# Patient Record
Sex: Female | Born: 1970 | Race: Asian | Hispanic: No | Marital: Married | Smoking: Never smoker
Health system: Southern US, Community
[De-identification: ages and names within clinical notes are randomized; demographics above are authoritative.]

## PROBLEM LIST (undated history)

## (undated) DIAGNOSIS — T7840XA Allergy, unspecified, initial encounter: Secondary | ICD-10-CM

## (undated) DIAGNOSIS — B001 Herpesviral vesicular dermatitis: Secondary | ICD-10-CM

## (undated) HISTORY — DX: Herpesviral vesicular dermatitis: B00.1

## (undated) HISTORY — DX: Allergy, unspecified, initial encounter: T78.40XA

---

## 2009-05-15 ENCOUNTER — Ambulatory Visit (HOSPITAL_COMMUNITY): Admission: RE | Admit: 2009-05-15 | Discharge: 2009-05-15 | Payer: Self-pay | Admitting: Obstetrics and Gynecology

## 2009-06-26 ENCOUNTER — Ambulatory Visit: Payer: Self-pay | Admitting: Oncology

## 2009-07-02 ENCOUNTER — Inpatient Hospital Stay (HOSPITAL_COMMUNITY): Admission: AD | Admit: 2009-07-02 | Discharge: 2009-07-02 | Payer: Self-pay | Admitting: Obstetrics

## 2009-07-05 LAB — PROTIME-INR

## 2009-07-05 LAB — CBC WITH DIFFERENTIAL/PLATELET
BASO%: 0.2 % (ref 0.0–2.0)
EOS%: 0.5 % (ref 0.0–7.0)
HCT: 34.8 % (ref 34.8–46.6)
HGB: 12.1 g/dL (ref 11.6–15.9)
MCH: 32.2 pg (ref 25.1–34.0)
MCHC: 34.8 g/dL (ref 31.5–36.0)
MCV: 92.5 fL (ref 79.5–101.0)
MONO#: 0.3 10*3/uL (ref 0.1–0.9)
NEUT%: 74 % (ref 38.4–76.8)
RBC: 3.77 10*6/uL (ref 3.70–5.45)
lymph#: 0.7 10*3/uL — ABNORMAL LOW (ref 0.9–3.3)

## 2009-07-05 LAB — MORPHOLOGY

## 2009-07-08 LAB — COMPREHENSIVE METABOLIC PANEL
ALT: 12 U/L (ref 0–35)
AST: 19 U/L (ref 0–37)
Albumin: 3.5 g/dL (ref 3.5–5.2)
Alkaline Phosphatase: 63 U/L (ref 39–117)
Potassium: 3.4 mEq/L — ABNORMAL LOW (ref 3.5–5.3)
Sodium: 137 mEq/L (ref 135–145)
Total Protein: 6.2 g/dL (ref 6.0–8.3)

## 2009-07-08 LAB — APTT: aPTT: 27 seconds (ref 24–37)

## 2009-09-06 ENCOUNTER — Inpatient Hospital Stay (HOSPITAL_COMMUNITY): Admission: AD | Admit: 2009-09-06 | Discharge: 2009-09-09 | Payer: Self-pay | Admitting: Obstetrics and Gynecology

## 2009-10-03 ENCOUNTER — Ambulatory Visit: Payer: Self-pay | Admitting: Oncology

## 2009-10-21 LAB — MORPHOLOGY: PLT EST: DECREASED

## 2009-10-21 LAB — CBC WITH DIFFERENTIAL/PLATELET
Basophils Absolute: 0 10*3/uL (ref 0.0–0.1)
Eosinophils Absolute: 0.1 10*3/uL (ref 0.0–0.5)
HCT: 38.6 % (ref 34.8–46.6)
HGB: 13.3 g/dL (ref 11.6–15.9)
LYMPH%: 29.2 % (ref 14.0–49.7)
MCHC: 34.4 g/dL (ref 31.5–36.0)
MONO#: 0.4 10*3/uL (ref 0.1–0.9)
NEUT#: 2.3 10*3/uL (ref 1.5–6.5)
NEUT%: 59.2 % (ref 38.4–76.8)
lymph#: 1.2 10*3/uL (ref 0.9–3.3)

## 2009-10-21 LAB — CHCC SMEAR

## 2009-12-18 ENCOUNTER — Ambulatory Visit: Payer: Self-pay | Admitting: Oncology

## 2010-02-18 ENCOUNTER — Ambulatory Visit: Payer: Self-pay | Admitting: Oncology

## 2010-07-22 LAB — CBC
HCT: 29.6 % — ABNORMAL LOW (ref 36.0–46.0)
HCT: 38.8 % (ref 36.0–46.0)
Hemoglobin: 10.6 g/dL — ABNORMAL LOW (ref 12.0–15.0)
Hemoglobin: 13.6 g/dL (ref 12.0–15.0)
MCV: 93 fL (ref 78.0–100.0)
Platelets: 84 10*3/uL — ABNORMAL LOW (ref 150–400)
RBC: 4.18 MIL/uL (ref 3.87–5.11)
RDW: 14.6 % (ref 11.5–15.5)
WBC: 6.3 10*3/uL (ref 4.0–10.5)

## 2010-07-22 LAB — ABO/RH: ABO/RH(D): O POS

## 2010-07-22 LAB — TYPE AND SCREEN: ABO/RH(D): O POS

## 2010-07-22 LAB — RPR: RPR Ser Ql: NONREACTIVE

## 2010-07-28 LAB — DIFFERENTIAL
Basophils Absolute: 0 10*3/uL (ref 0.0–0.1)
Basophils Relative: 0 % (ref 0–1)
Eosinophils Relative: 0 % (ref 0–5)
Lymphocytes Relative: 6 % — ABNORMAL LOW (ref 12–46)
Monocytes Absolute: 0.3 10*3/uL (ref 0.1–1.0)
Neutro Abs: 4.6 10*3/uL (ref 1.7–7.7)

## 2010-07-28 LAB — CBC
MCV: 92.1 fL (ref 78.0–100.0)
Platelets: 95 10*3/uL — ABNORMAL LOW (ref 150–400)

## 2012-10-14 ENCOUNTER — Ambulatory Visit (INDEPENDENT_AMBULATORY_CARE_PROVIDER_SITE_OTHER)
Admission: RE | Admit: 2012-10-14 | Discharge: 2012-10-14 | Disposition: A | Discharge status: Home / Self Care | Payer: 59 | Source: Ambulatory Visit | Attending: Internal Medicine | Admitting: Internal Medicine

## 2012-10-14 ENCOUNTER — Ambulatory Visit (INDEPENDENT_AMBULATORY_CARE_PROVIDER_SITE_OTHER): Payer: 59 | Admitting: Internal Medicine

## 2012-10-14 ENCOUNTER — Encounter: Payer: Self-pay | Admitting: Internal Medicine

## 2012-10-14 VITALS — BP 106/70 | HR 72 | Temp 98.3°F | Ht 63.0 in | Wt 119.0 lb

## 2012-10-14 DIAGNOSIS — R05 Cough: Secondary | ICD-10-CM

## 2012-10-14 DIAGNOSIS — R059 Cough, unspecified: Secondary | ICD-10-CM

## 2012-10-14 DIAGNOSIS — J309 Allergic rhinitis, unspecified: Secondary | ICD-10-CM

## 2012-10-14 MED ORDER — PREDNISONE (PAK) 10 MG PO TABS: ORAL_TABLET | ORAL | Status: DC

## 2012-10-14 MED ORDER — TRAMADOL HCL 50 MG PO TABS: ORAL_TABLET | ORAL | Status: DC

## 2012-10-14 NOTE — Patient Instructions (Addendum)
The key to effective treatment for your cough is eliminating the non-stop cycle of cough you're stuck in long enough to let your airway heal completely and then see if there is anything still making you cough once you stop the cough suppression, but this should take no more than 5 days to figure out  First take delsym two tsp every 12 hours and supplement if needed with  tramadol 50 mg up to 2 every 4 hours to suppress the urge to cough at all or even clear your throat. Swallowing water or using ice chips/non mint and menthol containing candies (such as lifesavers or sugarless jolly ranchers) are also effective.  You should rest your voice and avoid activities that you know make you cough.  Once you have eliminated the cough for 3 straight days try reducing the tramadol first,  then the delsym as tolerated.    Try prilosec 20mg   Take 30-60 min before first meal of the day and Pepcid 20 mg one bedtime until cough is completely gone for at least a week without the need for cough suppression   GERD (REFLUX)  is an extremely common cause of respiratory symptoms, many times with no significant heartburn at all.    It can be treated with medication, but also with lifestyle changes including avoidance of late meals, excessive alcohol, smoking cessation, and avoid fatty foods, chocolate, peppermint, colas, red wine, and acidic juices such as orange juice.  NO MINT OR MENTHOL PRODUCTS SO NO COUGH DROPS  USE SUGARLESS CANDY INSTEAD (jolley ranchers or Stover's)  NO OIL BASED VITAMINS - use powdered substitutes.    Prednisone 10 mg take  4 each am x 2 days,   2 each am x 2 days,  1 each am x 2 days and stop    Tramadol also works for pain   Add chlortrimeton 4mg  in evening as long as having any runny nose, cough, drainage   Please remember to go to the  x-ray department downstairs for your tests - we will call you with the results when they are available.    If not better call [facility] [phone] to  schedule a sinus ct and follow up with me

## 2012-10-14 NOTE — Progress Notes (Signed)
  Subjective:    Patient ID: Morgan Conley, female    DOB: 07-Sep-1970  MRN: [id number]  HPI  42 yo [place] female never smoker never resp problems until round 2008 when arrived in [place] noted onset April yearly  classic seasonal rhinitis never had cough with it until  Onset April 2014 referred 10/14/2012 by Dr Dewaine Oats for refractory cough  10/14/2012 1st pulmonary eval cc cough worse after supper esp using when voice a lot  X 2 months,  Prod min mucus white, did get some better with antibiotics,  Coughs so hard hurts to cough R post > ant cp with neg cxr per pt around 1st week in May 2014.    No obvious daytime variabilty or assoc sob or cp or chest tightness, subjective wheeze overt sinus or hb symptoms. No unusual exp hx or h/o childhood pna/ asthma or knowledge of premature birth.   Sleeping ok without nocturnal  or early am exacerbation  of respiratory  c/o's or need for noct saba. Also denies any obvious fluctuation of symptoms with weather or environmental changes or other aggravating or alleviating factors except as outlined above   .   Review of Systems  Constitutional: Negative for fever, chills and unexpected weight change.  HENT: Negative for ear pain, nosebleeds, congestion, sore throat, rhinorrhea, sneezing, trouble swallowing, dental problem, voice change, postnasal drip and sinus pressure.   Eyes: Negative for visual disturbance.  Respiratory: Positive for cough. Negative for choking and shortness of breath.   Cardiovascular: Positive for chest pain. Negative for leg swelling.  Gastrointestinal: Negative for vomiting, abdominal pain and diarrhea.  Genitourinary: Negative for difficulty urinating.  Musculoskeletal: Negative for arthralgias.  Skin: Negative for rash.  Neurological: Negative for tremors, syncope and headaches.  Hematological: Does not bruise/bleed easily.       Objective:   Physical Exam  Pleasant amb wf nad  <  Stated age Wt Readings from Last 3 Encounters:   10/14/12 119 lb (53.978 kg)    HEENT: nl dentition, turbinates, and orophanx. Nl external ear canals without cough reflex   NECK :  without JVD/Nodes/TM/ nl carotid upstrokes bilaterally   LUNGS: no acc muscle use, clear to A and P bilaterally without cough on insp or exp maneuvers   CV:  RRR  no s3 or murmur or increase in P2, no edema   ABD:  soft and nontender with nl excursion in the supine position. No bruits or organomegaly, bowel sounds nl  MS:  warm without deformities, calf tenderness, cyanosis or clubbing  SKIN: warm and dry without lesions    NEURO:  alert, approp, no deficits    CXR  10/14/2012 :  Hyperinflated lungs without acute infiltrate      Assessment & Plan:

## 2012-10-15 DIAGNOSIS — J309 Allergic rhinitis, unspecified: Secondary | ICD-10-CM | POA: Insufficient documentation

## 2012-10-15 NOTE — Assessment & Plan Note (Signed)
The most common causes of chronic cough in immunocompetent adults include the following: upper airway cough syndrome (UACS), previously referred to as postnasal drip syndrome (PNDS), which is caused by variety of rhinosinus conditions; (2) asthma; (3) GERD; (4) chronic bronchitis from cigarette smoking or other inhaled environmental irritants; (5) nonasthmatic eosinophilic bronchitis; and (6) bronchiectasis.   These conditions, singly or in combination, have accounted for up to 94% of the causes of chronic cough in prospective studies.   Other conditions have constituted no >6% of the causes in prospective studies These have included bronchogenic carcinoma, chronic interstitial pneumonia, sarcoidosis, left ventricular failure, ACEI-induced cough, and aspiration from a condition associated with pharyngeal dysfunction.    Chronic cough is often simultaneously caused by more than one condition. A single cause has been found from 38 to 82% of the time, multiple causes from 18 to 62%. Multiply caused cough has been the result of three diseases up to 42% of the time.      Most likely this is  Classic Upper airway cough syndrome, so named because it's frequently impossible to sort out how much is  CR/sinusitis with freq throat clearing (which can be related to primary GERD)   vs  causing  secondary (" extra esophageal")  GERD from wide swings in gastric pressure that occur with throat clearing, often  promoting self use of mint and menthol lozenges that reduce the lower esophageal sphincter tone and exacerbate the problem further in a cyclical fashion.   These are the same pts (now being labeled as having "irritable larynx syndrome" by some cough centers) who not infrequently have a history of having failed to tolerate ace inhibitors,  dry powder inhalers or biphosphonates or report having atypical reflux symptoms that don't respond to standard doses of PPI , and are easily confused as having aecopd or asthma  flares by even experienced allergists/ pulmonologists.  CP likely form muscle/rib injury from coughing so needs cyclical cough protocol that uses tramadol to completely eliminate the cough plus change to 1st gen H1 to eliminate pnds.  The standardized cough guidelines published in Chest by [place] in 2006 are still the best available and consist of a multiple step process (up to 12!) , not a single office visit,  and are intended  to address this problem logically,  with an alogrithm dependent on response to empiric treatment at  each progressive step  to determine a specific diagnosis with  minimal addtional testing needed. Therefore if adherence is an issue or can't be accurately verified,  it's very unlikely the standard evaluation and treatment will be successful here.    Furthermore, response to therapy (other than acute cough suppression, which should only be used short term with avoidance of narcotic containing cough syrups if possible), can be a gradual process for which the patient may perceive immediate benefit.  Unlike going to an eye doctor where the best perscription is almost always the first one and is immediately effective, this is almost never the case in the management of chronic cough syndromes. Therefore the patient needs to commit up front to consistently adhere to recommendations  for up to 6 weeks of therapy directed at the likely underlying problem(s) before the response can be reasonably evaluated.

## 2012-10-25 ENCOUNTER — Telehealth: Payer: Self-pay | Admitting: Internal Medicine

## 2012-10-25 NOTE — Telephone Encounter (Signed)
Notes Recorded by Nyoka Cowden, MD on 10/14/2012 at 5:03 PM Call pt: Reviewed cxr and no acute change so no change in recommendations made at ov   I spoke with patient about results and she verbalized understanding.

## 2014-11-14 IMAGING — CR DG CHEST 2V
2 series · 2 of 2 positions shown · non-contrast
Comparison: 09/14/2012

CLINICAL DATA: Right upper chest pain, cough

CHEST - 2 VIEW

[view not recorded (1 of 2)]
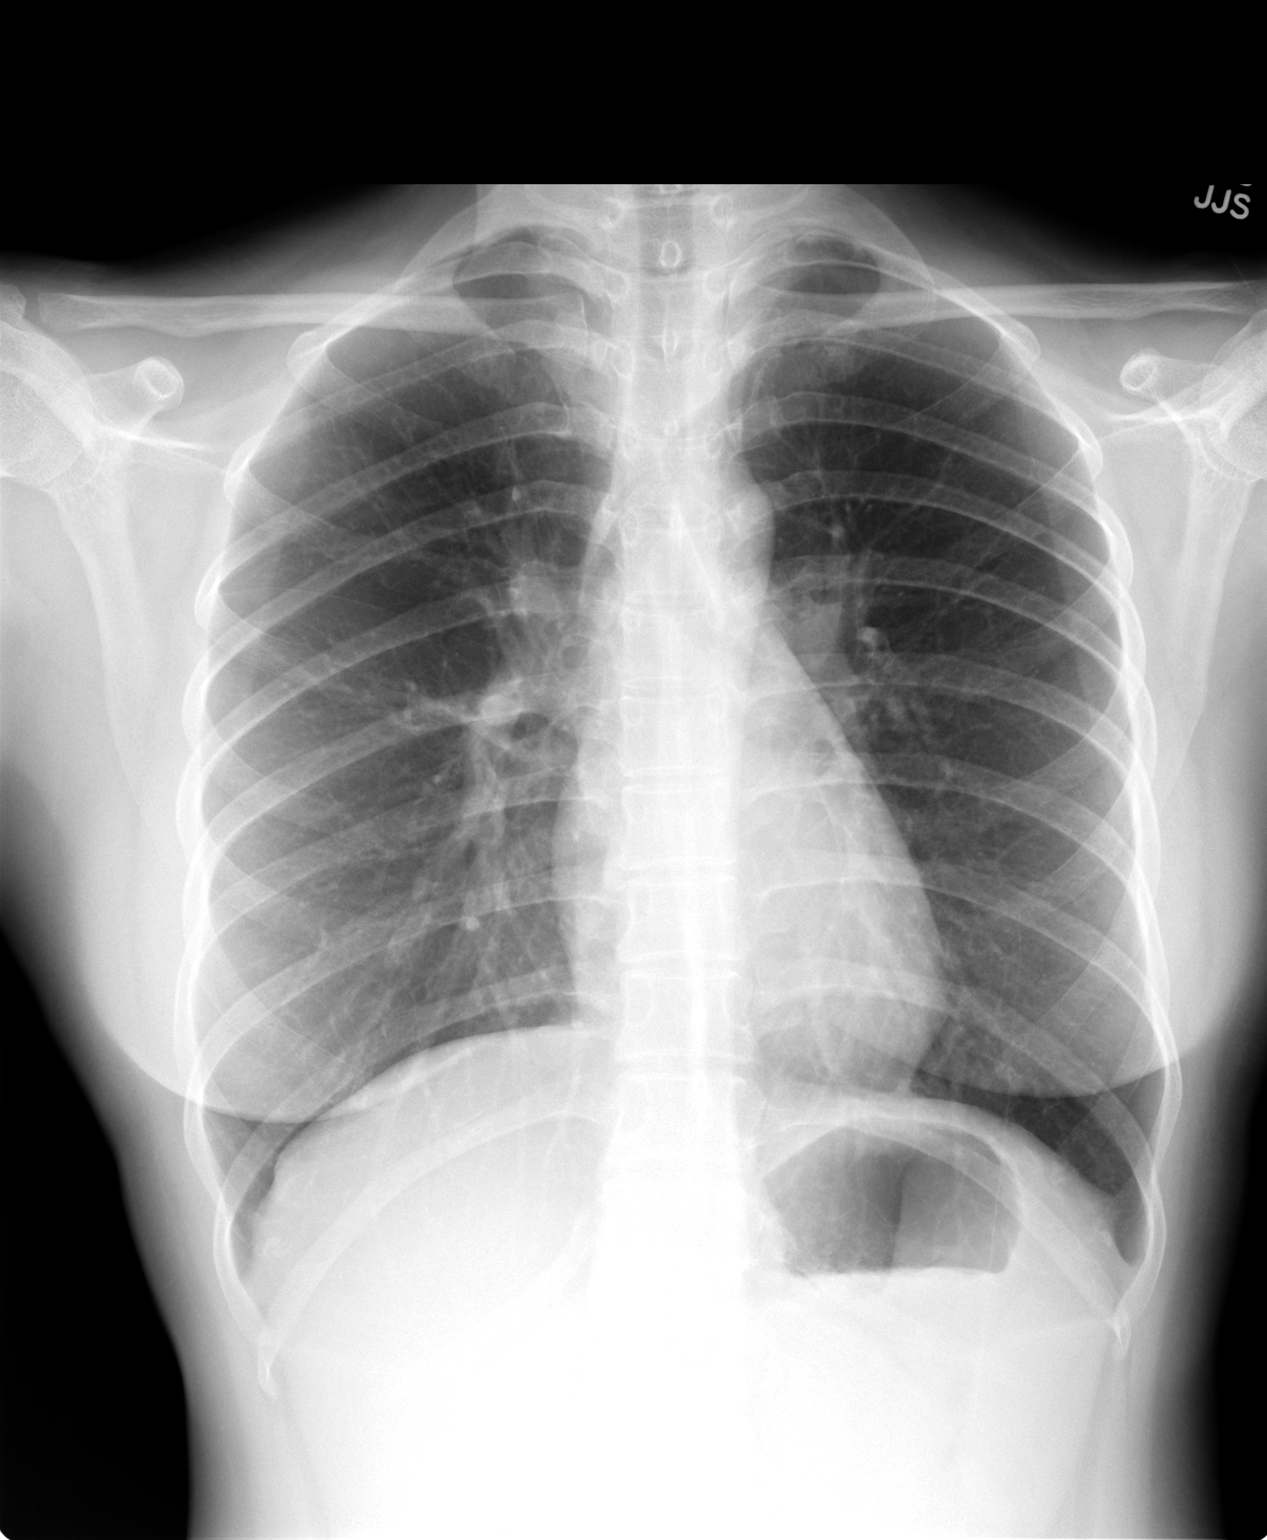

[view not recorded (2 of 2)]
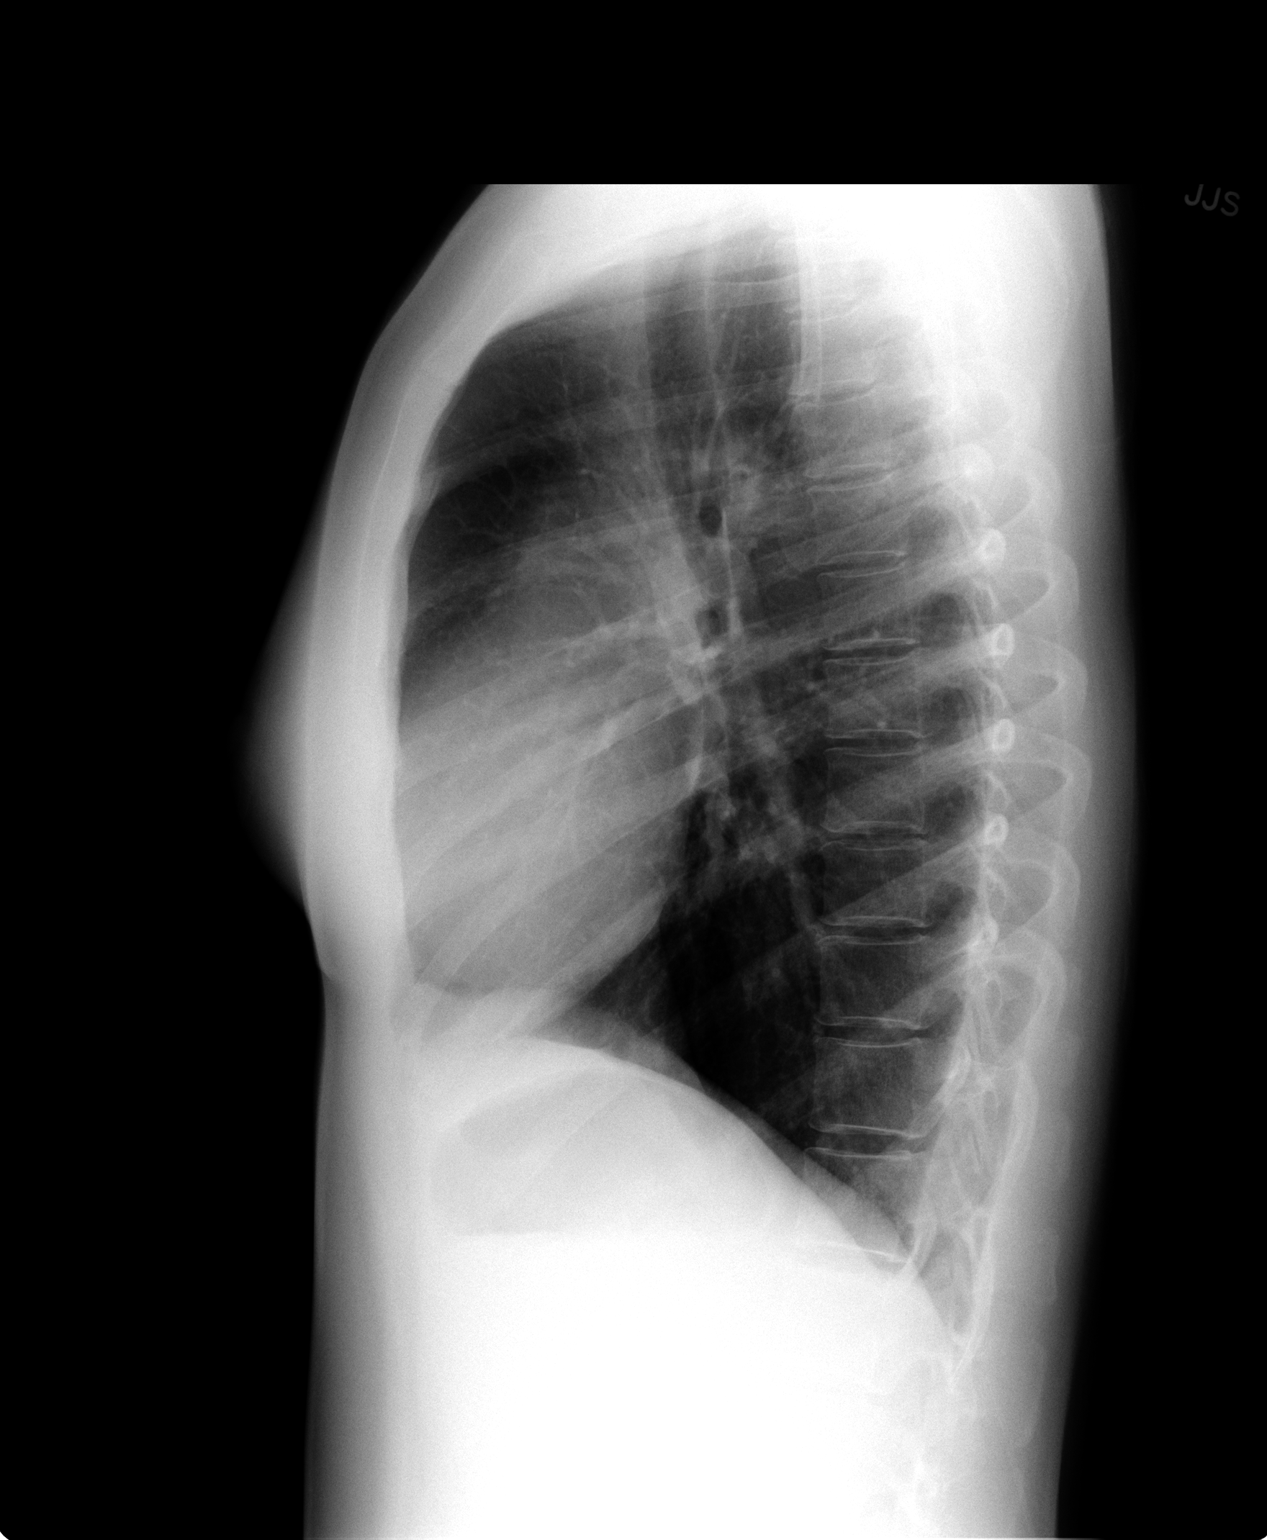

[2 of 2 positions shown; findings below may reference images not displayed]

FINDINGS: Normal heart size, mediastinal contours, and pulmonary vascularity.
Lungs hyperinflated but clear.
No pleural effusion or pneumothorax.
Bones unremarkable.
IMPRESSION: Hyperinflated lungs without acute infiltrate.

## 2017-11-01 LAB — HM MAMMOGRAPHY

## 2017-11-01 LAB — HM PAP SMEAR

## 2017-11-16 DIAGNOSIS — Z1231 Encounter for screening mammogram for malignant neoplasm of breast: Secondary | ICD-10-CM | POA: Diagnosis not present

## 2017-11-16 DIAGNOSIS — Z6822 Body mass index (BMI) 22.0-22.9, adult: Secondary | ICD-10-CM | POA: Diagnosis not present

## 2017-11-16 DIAGNOSIS — Z01419 Encounter for gynecological examination (general) (routine) without abnormal findings: Secondary | ICD-10-CM | POA: Diagnosis not present

## 2017-12-14 ENCOUNTER — Encounter: Payer: Self-pay | Admitting: Family Medicine

## 2017-12-14 ENCOUNTER — Ambulatory Visit: Payer: Commercial Managed Care - PPO | Admitting: Family Medicine

## 2017-12-14 ENCOUNTER — Other Ambulatory Visit: Payer: Self-pay

## 2017-12-14 VITALS — BP 122/82 | HR 71 | Temp 97.9°F | Ht 63.0 in | Wt 125.8 lb

## 2017-12-14 DIAGNOSIS — B001 Herpesviral vesicular dermatitis: Secondary | ICD-10-CM | POA: Diagnosis not present

## 2017-12-14 DIAGNOSIS — J301 Allergic rhinitis due to pollen: Secondary | ICD-10-CM | POA: Diagnosis not present

## 2017-12-14 HISTORY — DX: Herpesviral vesicular dermatitis: B00.1

## 2017-12-14 MED ORDER — VALACYCLOVIR HCL 1 G PO TABS: 2000.0000 mg | ORAL_TABLET | Freq: Two times a day (BID) | ORAL | 2 refills | Status: DC

## 2017-12-14 NOTE — Progress Notes (Signed)
Subjective  CC:  Chief Complaint  Patient presents with  . Establish Care    Last saw a PCP in 2014, sees OB-GYN Dr. Billy Coastaavon 11/2017  . Mouth Lesions    Had Cold sore and was on Antiviral Meds, she states she feels a needle like feeling in her lips     HPI: Morgan Conley is a 47 y.o. female who [facility] to establish care with me as a new patient.   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   Had large painful cold sore 2 weeks ago. Onset same as menstrual cycle. [facility] with acyclovir tid x 10 days. Pt reports improvement but still with occ paresthesia. As well she is concerned that it will return with her menstrual cycle. First ever cold sore. No known trigger/stressors.   AR on meds as needed.  HM: up to date; see gyn. Likely had Tdap but will get records.   Assessment  1. Seasonal allergic rhinitis due to pollen   2. Recurrent cold sores      Plan   AR  Counseling and education on cold sores, etiology, prognosis and recommended tx if recurs. Reassured, should resolve completely soon. advil as needed. Valtrex 2gbid if recurs.   F/u for cpe in jan 2020  Orders Placed This Encounter  Procedures  . HM MAMMOGRAPHY  . HM PAP SMEAR   Meds ordered this encounter  Medications  . valACYclovir (VALTREX) 1000 MG tablet    Sig: Take 2 tablets (2,000 mg total) by mouth 2 (two) times daily. Once, as needed for cold sores    Dispense:  20 tablet    Refill:  2     [facility] 12/14/2017  Decreased Interest 0  Down, Depressed, Hopeless 0  PHQ - 2 Score 0    We updated and reviewed the patient's past history in detail and it is documented below.  Patient Active Problem List   Diagnosis Date Noted  . 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 Allergic rhinitis 10/15/2012   Health Maintenance  Topic Date Due    . Janet BerlinETANUS/TDAP  04/20/1990  . INFLUENZA VACCINE  12/02/2017  . PAP SMEAR  11/01/2020  . HIV Screening  Completed   There is no immunization history for the selected administration types on file for this patient. No outpatient medications have been marked as taking for the 12/14/17 encounter (Office Visit) with [name], [name], MD.    Allergies: Patient has No Known Allergies. Past Medical History Patient  has a past medical history of Allergy and Recurrent cold sores (12/14/2017). Past Surgical History Patient  has a past surgical history that includes Cesarean section (2003, 2011). Family History: Patient family history includes Hypertension in her father. Social History:  Patient  reports that she has never smoked. She has never used smokeless tobacco. She reports that she drinks alcohol. She reports that she does not use drugs.  Review of Systems: Constitutional: negative for fever or malaise Ophthalmic: negative for photophobia, double vision or loss of vision Cardiovascular: negative for chest pain, dyspnea on exertion, or new LE swelling Respiratory: negative for SOB or persistent cough Gastrointestinal: negative for abdominal pain, change in bowel habits or melena Genitourinary: negative for dysuria or gross hematuria Musculoskeletal: negative for new gait disturbance or muscular weakness Integumentary: negative for new or persistent rashes Neurological:  negative for TIA or stroke symptoms Psychiatric: negative for SI or delusions Allergic/Immunologic: negative for hives  Patient Care Team    Relationship Specialty Notifications Start End  Marinda ElkFried, Robert, MD PCP - General Family Medicine  10/14/12     Objective  Vitals: BP 122/82   Pulse 71   Temp 97.9 F (36.6 C)   Ht 5\' 3"  (1.6 m)   Wt 125 lb 12.8 oz (57.1 kg)   SpO2 96%   BMI 22.28 kg/m  General:  Well developed, well nourished, no acute distress  Psych:  Alert and oriented,normal mood and affect HEENT:   Normocephalic, atraumatic, non-icteric sclera, PERRL, oropharynx is without mass or exudate, supple neck without adenopathy, mass or thyromegaly Cardiovascular:  RRR without gallop, rub or murmur, nondisplaced PMI Respiratory:  Good breath sounds bilaterally, CTAB with normal respiratory effort   Commons side effects, risks, benefits, and alternatives for medications and treatment plan prescribed today were discussed, and the patient expressed understanding of the given instructions. Patient is instructed to call or message via MyChart if he/she has any questions or concerns regarding our treatment plan. No barriers to understanding were identified. We discussed Red Flag symptoms and signs in detail. Patient expressed understanding regarding what to do in case of urgent or emergency type symptoms.   Medication list was reconciled, printed and provided to the patient in AVS. Patient instructions and summary information was reviewed with the patient as documented in the AVS. This note was prepared with assistance of Dragon voice recognition software. Occasional wrong-word or sound-a-like substitutions may have occurred due to the inherent limitations of voice recognition software

## 2017-12-14 NOTE — Patient Instructions (Signed)
Please return in January for your annual complete physical; please come fasting.  Please sign a release of information from Dr. Billy Coastaavon.  It was a pleasure meeting you today! Thank you for choosing us to meet your healthcare needs! I truly look forward to working with you. If you have any questions or concerns, please send me a message via Mychart or call the office at [phone].   Cold Sore A cold sore, also called a fever blister, is a skin infection that causes small, fluid-filled sores to form inside of the mouth or on the lips, gums, nose, chin, or cheeks. Cold sores can spread to other parts of the body, such as the eyes or fingers. In some people with other medical conditions, cold sores can spread to multiple other body sites, including the genitals. Cold sores can be spread or passed from person to person (contagious) until the sores crust over completely. What are the causes? Cold sores are caused by the herpes simplex virus (HSV-1). HSV-1 is closely related to the virus that causes genital herpes (HSV-2), but these viruses are not the same. Once a person is infected with HSV-1, the virus remains permanently in the body. HSV-1 is spread from person to person through close contact, such as through kissing, touching the affected area, or sharing personal items such as lip balm, razors, or eating utensils. What increases the risk? A cold sore outbreak is more likely to develop in people who:  Are tired, stressed, or sick.  Are menstruating.  Are pregnant.  Take certain medicines.  Are exposed to cold weather or too much sun.  What are the signs or symptoms? Symptoms of a cold sore outbreak often go through different stages. Here is how a cold sore develops:  Tingling, itching, or burning is felt 1-2 days before the outbreak.  Fluid-filled blisters appear on the lips, inside the mouth, on the nose, or on the cheeks.  The blisters start to ooze clear fluid.  The blisters dry  up and a yellow crust appears in its place.  The crust falls off.  Other symptoms include:  Fever.  Sore throat.  Headache.  Muscle aches.  Swollen neck glands.  You also may not have any symptoms. How is this diagnosed? This condition is often diagnosed based on your medical history and a physical exam. Your health care provider may swab your sore and then examine it in the lab. Rarely, blood tests may be done to check for HSV-1. How is this treated? There is no cure for cold sores or HSV-1. There also is no vaccine for HSV-1. Most cold sores go away on their own without treatment within two weeks. Medicines cannot make the infection go away, but medicines can:  Help relieve some of the pain associated with the sores.  Work to stop the virus from multiplying.  Shorten healing time.  Medicines may be in the form of creams, gels, pills, or a shot. Follow these instructions at home: Medicines  Take or apply over-the-counter and prescription medicines only as told by your health care provider.  Use a cotton-tip swab to apply creams or gels to your sores. Sore Care  Do not touch the sores or pick the scabs.  Wash your hands often. Do not touch your eyes without washing your hands first.  Keep the sores clean and dry.  If directed, apply ice to the sores: ? Put ice in a plastic bag. ? Place a towel between your skin and the bag. ?  Leave the ice on for 20 minutes, 2-3 times per day. Lifestyle  Do not kiss, have oral sex, or share personal items until your sores heal.  Eat a soft, bland diet. Avoid eating hot, cold, or salty foods. These can hurt your mouth.  Use a straw if it hurts to drink out of a glass.  Avoid the sun and limit your stress if these things trigger outbreaks. If sun causes cold sores, apply sunscreen on your lips before being out in the sun. Contact a health care provider if:  You have symptoms for more than two weeks.  You have pus coming from  the sores.  You have redness that is spreading.  You have pain or irritation in your eye.  You get sores on your genitals.  Your sores do not heal within two weeks.  You have frequent cold sore outbreaks. Get help right away if:  You have a fever and your symptoms suddenly get worse.  You have a headache and confusion. This information is not intended to replace advice given to you by your health care provider. Make sure you discuss any questions you have with your health care provider. Document Released: 04/17/2000 Document Revised: 12/13/2015 Document Reviewed: 02/08/2015 Elsevier Interactive Patient Education  [employer]2018 [employer]

## 2018-01-07 ENCOUNTER — Other Ambulatory Visit: Payer: Self-pay

## 2018-01-07 ENCOUNTER — Ambulatory Visit: Payer: Commercial Managed Care - PPO | Admitting: Family Medicine

## 2018-01-07 ENCOUNTER — Encounter: Payer: Self-pay | Admitting: Family Medicine

## 2018-01-07 VITALS — BP 118/80 | HR 73 | Temp 98.2°F | Ht 63.0 in | Wt 124.8 lb

## 2018-01-07 DIAGNOSIS — L710 Perioral dermatitis: Secondary | ICD-10-CM

## 2018-01-07 DIAGNOSIS — K120 Recurrent oral aphthae: Secondary | ICD-10-CM | POA: Diagnosis not present

## 2018-01-07 LAB — CBC WITH DIFFERENTIAL/PLATELET
BASOS ABS: 0 10*3/uL (ref 0.0–0.1)
Basophils Relative: 0.7 % (ref 0.0–3.0)
EOS ABS: 0.1 10*3/uL (ref 0.0–0.7)
Eosinophils Relative: 1.9 % (ref 0.0–5.0)
HCT: 38.5 % (ref 36.0–46.0)
Hemoglobin: 12.6 g/dL (ref 12.0–15.0)
LYMPHS ABS: 1.3 10*3/uL (ref 0.7–4.0)
Lymphocytes Relative: 25.6 % (ref 12.0–46.0)
MCHC: 32.8 g/dL (ref 30.0–36.0)
MCV: 83.3 fl (ref 78.0–100.0)
MONO ABS: 0.5 10*3/uL (ref 0.1–1.0)
Monocytes Relative: 9.8 % (ref 3.0–12.0)
NEUTROS PCT: 62 % (ref 43.0–77.0)
Neutro Abs: 3.2 10*3/uL (ref 1.4–7.7)
Platelets: 178 10*3/uL (ref 150.0–400.0)
RBC: 4.62 Mil/uL (ref 3.87–5.11)
RDW: 15.1 % (ref 11.5–15.5)
WBC: 5.2 10*3/uL (ref 4.0–10.5)

## 2018-01-07 LAB — B12 AND FOLATE PANEL
FOLATE: 19.8 ng/mL (ref >5.9)
VITAMIN B 12: 472 pg/mL (ref 211–911)

## 2018-01-07 LAB — TSH: TSH: 1.11 u[IU]/mL (ref 0.35–4.50)

## 2018-01-07 MED ORDER — KETOCONAZOLE 2 % EX CREA: TOPICAL_CREAM | CUTANEOUS | 0 refills | Status: DC

## 2018-01-07 NOTE — Patient Instructions (Addendum)
Please follow up if symptoms do not improve or as needed.  Start taking ibuprofen 2tablets 2-3x/day with meals to treat the pain. Use Oragel - apply to sores on inside of lip and tongue to numb the pain Start Vitamin C daily Oral Ulcers Oral ulcers are sores inside the mouth or near the mouth. They may be called canker sores or cold sores, which are two types of oral ulcers. Many oral ulcers are harmless and go away on their own. In some cases, oral ulcers may require medical care to determine the cause and proper treatment. What are the causes? Common causes of this condition include:  Viral, bacterial, or fungal infection.  Emotional stress.  Foods or chemicals that irritate the mouth.  Injury or physical irritation of the mouth.  Medicines.  Allergies.  Tobacco use.  Less common causes include:  Skin disease.  A type of herpes virus infection (herpes simplexor herpes zoster).  Oral cancer.  In some cases, the cause of this condition may not be known. What increases the risk? Oral ulcers are more likely to develop in:  People who wear dental braces, dentures, or retainers.  People who do not keep their mouth clean or brush their teeth regularly.  People who have sensitive skin.  People who have conditions that affect the entire body (systemic conditions), such as immune disorders.  What are the signs or symptoms? The main symptom of this condition is one or more oval-shaped or round ulcers that have red borders. Details about symptoms may vary depending on the cause.  Location of the ulcers. They may be inside the mouth, on the gums, or on the insides of the lips or cheeks. They may also be on the lips or on skin that is near the mouth, such as the cheeks and chin.  Pain. Ulcers can be painful and uncomfortable, or they can be painless.  Appearance of the ulcers. They may look like red blisters and be filled with fluid, or they may be white or yellow  patches.  Frequency of outbreaks. Ulcers may go away permanently after one outbreak, or they may come back (recur) often or rarely.  How is this diagnosed? This condition is diagnosed with a physical exam. Your health care provider may ask you questions about your lifestyle and your medical history. You may have tests, including:  Blood tests.  Removal of a small number of cells from an ulcer to be examined under a microscope (biopsy).  How is this treated? This condition is treated by managing any pain and discomfort, and by treating the underlying cause of the ulcers, if necessary. Usually, oral ulcers resolve by themselves in 1-2 weeks. You may be told to keep your mouth clean and avoid things that cause or irritate your ulcers. Your health care provider may prescribe medicines to reduce pain and discomfort or treat the underlying cause, if this applies. Follow these instructions at home: Lifestyle  Follow instructions from your health care provider about eating or drinking restrictions. ? Drink enough fluid to keep your urine clear or pale yellow. ? Avoid foods and drinks that irritate your ulcers.  Avoid tobacco products, including cigarettes, chewing tobacco, or e-cigarettes. If you need help quitting, ask your health care provider.  Avoid excessive alcohol use. Oral Hygiene  Avoid physical or chemical irritants that may have caused the ulcers or made them worse, such as mouthwashes that contain alcohol (ethanol). If you wear dental braces, dentures, or retainers, work with your health care  provider to make sure these devices are fitted correctly.  Brush and floss your teeth at least once every day, and get regular dental cleanings and checkups.  Gargle with a salt-water mixture 3-4 times per day or as told by your health care provider. To make a salt-water mixture, completely dissolve -1 tsp of salt in 1 cup of warm water. General instructions  Take over-the-counter and  prescription medicines only as told by your health care provider.  If you have pain, wrap a cold compress in a towel and gently press it against your face to help reduce pain.  Keep all follow-up visits as told by your health care provider. This is important. Contact a health care provider if:  You have pain that gets worse or does not get better with medicine.  You have 4 or more ulcers at one time.  You have a fever.  You have new ulcers that look or feel different from other ulcers you have.  You have inflammation in one eye or both eyes.  You have ulcers that do not go away after 10 days.  You develop new symptoms in your mouth, such as: ? Bleeding or crusting around your lips or gums. ? Tooth pain. ? Difficulty swallowing.  You develop symptoms on your skin or genitals, such as: ? A rash or blisters. ? Burning or itching sensations.  Your ulcers begin or get worse after you start a new medicine. Get help right away if:  You have difficulty breathing.  You have swelling in your face or neck.  You have excessive bleeding from your mouth.  You have severe pain. This information is not intended to replace advice given to you by your health care provider. Make sure you discuss any questions you have with your health care provider. Document Released: 05/28/2004 Document Revised: 09/23/2015 Document Reviewed: 09/05/2014 Elsevier Interactive Patient Education  [employer].

## 2018-01-07 NOTE — Progress Notes (Signed)
   Subjective  CC:  Chief Complaint  Patient presents with  . Mouth Lesions    sores on inner lip and tongue, medication not helping, wants to discuss flu and Tdap     HPI: Morgan Conley is a 47 y.o. female who presents to the office today to address the problems listed above in the chief complaint.  See last note; urgent care treated for cold sores; they keep returning: describes small ulcers on inside of lower lip and tongue. Also with bumps at corner of mouth. No recent illnesses. No f/c/s. No ST.    Assessment  1. Oral aphthae   2. Perioral dermatitis      Plan   Oral apthae and perioral dermatitis:  Educated. Not hsv cold sores. Supportive care and ketoconazole for dermatitis. Check labs for thyroid and vit deficiences  Follow up: No follow-ups on file.   Orders Placed This Encounter  Procedures  . TSH  . B12 and Folate Panel  . CBC with Differential/Platelet   Meds ordered this encounter  Medications  . ketoconazole (NIZORAL) 2 % cream    Sig: Use twice daily on the corners of the mouth for 1-2 weeks    Dispense:  30 g    Refill:  0      I reviewed the patients updated PMH, FH, and SocHx.    Patient Active Problem List   Diagnosis Date Noted  . 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 Allergic rhinitis 10/15/2012   Current Meds  Medication Sig  . valACYclovir (VALTREX) 1000 MG tablet Take 2 tablets (2,000 mg total) by mouth 2 (two) times daily. Once, as needed for cold sores    Allergies: Patient has No Known Allergies. Family History: Patient family history includes Hypertension in her father. Social History:  Patient  reports that she has never smoked. She has never used smokeless tobacco. She reports that she drinks alcohol. She reports that she does not use drugs.  Review of Systems: Constitutional: Negative for fever malaise or anorexia Cardiovascular: negative for chest pain Respiratory: negative for SOB or persistent cough Gastrointestinal: negative  for abdominal pain  Objective  Vitals: BP 118/80   Pulse 73   Temp 98.2 F (36.8 C)   Ht 5\' 3"  (1.6 m)   Wt 124 lb 12.8 oz (56.6 kg)   SpO2 98%   BMI 22.11 kg/m  General: no acute distress , A&Ox3 HEENT: PEERL, conjunctiva normal, Oropharynx moist,neck is supple No labial sores: inner lower oral mucosa with 2 small ulcerations and several on tongue; angular papules present. Posterior pharynx is clear. No LAD  Commons side effects, risks, benefits, and alternatives for medications and treatment plan prescribed today were discussed, and the patient expressed understanding of the given instructions. Patient is instructed to call or message via MyChart if he/she has any questions or concerns regarding our treatment plan. No barriers to understanding were identified. We discussed Red Flag symptoms and signs in detail. Patient expressed understanding regarding what to do in case of urgent or emergency type symptoms.   Medication list was reconciled, printed and provided to the patient in AVS. Patient instructions and summary information was reviewed with the patient as documented in the AVS. This note was prepared with assistance of Dragon voice recognition software. Occasional wrong-word or sound-a-like substitutions may have occurred due to the inherent limitations of voice recognition software

## 2018-01-09 ENCOUNTER — Encounter: Payer: Self-pay | Admitting: Family Medicine

## 2018-01-09 NOTE — Progress Notes (Signed)
Please call patient: I have reviewed his/her lab results. All blood test results are normal. I hope she is feeling better

## 2019-09-09 ENCOUNTER — Ambulatory Visit: Payer: Self-pay | Attending: Internal Medicine

## 2019-09-09 DIAGNOSIS — Z23 Encounter for immunization: Secondary | ICD-10-CM

## 2019-09-09 NOTE — Progress Notes (Signed)
   Covid-19 Vaccination Clinic  Name:  Morgan Conley    MRN: [id number] DOB: 06-28-1970  09/09/2019  Morgan Conley was observed post Covid-19 immunization for 15 minutes.She complained of slight dizziness & headache. BP was cecked x2, 5 minutes apart, with results 156/91 HR 75 & 157/93 HR 73. She reports that is her normal diastolic and pulse. Recommended she follow up with her PCP on Monday. Symptoms were resolving by the time she left.   She was provided with Vaccine Information Sheet and instruction to access the V-Safe system.   Morgan Conley was instructed to call 911 with any severe reactions post vaccine: Marland Kitchen Difficulty breathing  . Swelling of face and throat  . A fast heartbeat  . A bad rash all over body  . Dizziness and weakness   Immunizations Administered    Name Date Dose VIS Date Route   Pfizer COVID-19 Vaccine 09/09/2019 11:41 AM 0.3 mL 06/28/2018 Intramuscular   Manufacturer: [employer], [employer]   Lot: [id number]   NDC: [address]-6

## 2019-10-03 ENCOUNTER — Ambulatory Visit: Payer: Self-pay | Attending: Internal Medicine

## 2019-10-03 DIAGNOSIS — Z23 Encounter for immunization: Secondary | ICD-10-CM

## 2019-10-03 NOTE — Progress Notes (Signed)
   Covid-19 Vaccination Clinic  Name:  Arbutus Nelligan    MRN: [id number] DOB: 1971-01-12  10/03/2019  Ms. Mairena was observed post Covid-19 immunization for 15 minutes without incident. She was provided with Vaccine Information Sheet and instruction to access the V-Safe system.   Ms. Zemanek was instructed to call 911 with any severe reactions post vaccine: Marland Kitchen Difficulty breathing  . Swelling of face and throat  . A fast heartbeat  . A bad rash all over body  . Dizziness and weakness   Immunizations Administered    Name Date Dose VIS Date Route   Pfizer COVID-19 Vaccine 10/03/2019 11:51 AM 0.3 mL 06/28/2018 Intramuscular   Manufacturer: [employer], [employer]   Lot: [id number]   NDC: [address]-5

## 2021-05-12 ENCOUNTER — Encounter: Payer: Self-pay | Admitting: Family Medicine

## 2021-05-12 ENCOUNTER — Other Ambulatory Visit: Payer: Self-pay

## 2021-05-12 ENCOUNTER — Ambulatory Visit (INDEPENDENT_AMBULATORY_CARE_PROVIDER_SITE_OTHER): Payer: Managed Care, Other (non HMO) | Admitting: Family Medicine

## 2021-05-12 VITALS — BP 139/94 | HR 72 | Temp 98.1°F | Ht 63.0 in | Wt 137.6 lb

## 2021-05-12 DIAGNOSIS — Z1211 Encounter for screening for malignant neoplasm of colon: Secondary | ICD-10-CM

## 2021-05-12 DIAGNOSIS — Z0000 Encounter for general adult medical examination without abnormal findings: Secondary | ICD-10-CM | POA: Diagnosis not present

## 2021-05-12 DIAGNOSIS — I10 Essential (primary) hypertension: Secondary | ICD-10-CM | POA: Diagnosis not present

## 2021-05-12 DIAGNOSIS — Z23 Encounter for immunization: Secondary | ICD-10-CM

## 2021-05-12 DIAGNOSIS — Z1212 Encounter for screening for malignant neoplasm of rectum: Secondary | ICD-10-CM

## 2021-05-12 MED ORDER — HYDROCHLOROTHIAZIDE 12.5 MG PO CAPS: 12.5000 mg | ORAL_CAPSULE | Freq: Every day | ORAL | 3 refills | Status: DC

## 2021-05-12 NOTE — Progress Notes (Signed)
Subjective  Chief Complaint  Patient presents with   Annual Exam    HPI: Morgan Conley is a 51 y.o. female who presents to [facility] today for a Female Wellness Visit. She also has the concerns and/or needs as listed above in the chief complaint. These will be addressed in addition to the Health Maintenance Visit.   Wellness Visit: annual visit with health maintenance review and exam without Pap  Health maintenance: 51 year old married female here for physical.  Last seen over 2 years ago.  GYN for female wellness, last seen 04/22/2020.  Reports mammogram and Pap smear done at that time.  She is scheduled again in February.  She is eligible for Shingrix, Tdap and flu vaccines.  Eligible for colon cancer screening.  Average risk. Chronic disease f/u and/or acute problem visit: (deemed necessary to be done in addition to the wellness visit): Hypertension: New diagnosis.  Has checked blood pressures over the last several months and diastolics run above 0000000 most of the time.  No chest pain shortness of breath.  Does run in her family.  Eats a healthy diet.  Assessment  1. Annual physical exam   2. Screening for colorectal cancer   3. Essential hypertension   4. Immunization due      Plan  Female Wellness Visit: Age appropriate Health Maintenance and Prevention measures were discussed with patient. Included topics are cancer screening recommendations, ways to keep healthy (see AVS) including dietary and exercise recommendations, regular eye and dental care, use of seat belts, and avoidance of moderate alcohol use and tobacco use.  Mammogram get GYN office next month.  Counseled on colon cancer screening options.  Patient elects Cologuard. BMI: discussed patient's BMI and encouraged positive lifestyle modifications to help get to or maintain a target BMI. HM needs and immunizations were addressed and ordered. See below for orders. See HM and immunization section for  updates.  Flu shot today.  She defers Shingrix and Tdap till next visits.  Counseling education given. Routine labs and screening tests ordered including cmp, cbc and lipids where appropriate. Discussed recommendations regarding Vit D and calcium supplementation (see AVS)  Chronic disease management visit and/or acute problem visit: Essential hypertension: New diagnosis.  Start low-dose Microzide 12.5 mg daily.  Education given.  Start low-sodium diet.  Recheck 3 months.    Follow up: 3 months to recheck blood pressure Orders Placed This Encounter  Procedures   Flu Vaccine QUAD 6+ mos PF IM (Fluarix Quad PF)   CBC with Differential/Platelet   Comprehensive metabolic panel   Hepatitis C antibody   Lipid panel   Cologuard   Meds ordered this encounter  Medications   hydrochlorothiazide (MICROZIDE) 12.5 MG capsule    Sig: Take 1 capsule (12.5 mg total) by mouth daily.    Dispense:  90 capsule    Refill:  3      Body mass index is 24.37 kg/m. 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   Allergic rhinitis 10/15/2012   Health Maintenance  Topic Date Due   Hepatitis C Screening  Never done   TETANUS/TDAP  Never done   COVID-19 Vaccine (3 - Booster for Pfizer series) 11/28/2019   Fecal DNA (Cologuard)  Never  done   Zoster Vaccines- Shingrix (1 of 2) Never done   MAMMOGRAM  06/27/2021 (Originally 04/17/2021)   PAP SMEAR-Modifier  04/03/2025   INFLUENZA VACCINE  Completed   HIV Screening  Completed   Pneumococcal Vaccine 68-46 Years old  Aged Out   HPV VACCINES  Aged Out   Immunization History  Administered Date(s) Administered   Influenza,inj,Quad PF,6+ Mos 05/12/2021   PFIZER(Purple Top)SARS-COV-2 Vaccination 09/09/2019, 10/03/2019   We updated and reviewed the patient's past history  in detail and it is documented below. Allergies: Patient has No Known Allergies. Past Medical History Patient  has a past medical history of Allergy and Recurrent cold sores (12/14/2017). Past Surgical History Patient  has a past surgical history that includes Cesarean section (2003, 2011). Family History: Patient family history includes Hypertension in her father. Social History:  Patient  reports that she has never smoked. She has never used smokeless tobacco. She reports current alcohol use. She reports that she does not use drugs.  Review of Systems: Constitutional: negative for fever or malaise Ophthalmic: negative for photophobia, double vision or loss of vision Cardiovascular: negative for chest pain, dyspnea on exertion, or new LE swelling Respiratory: negative for SOB or persistent cough Gastrointestinal: negative for abdominal pain, change in bowel habits or melena Genitourinary: negative for dysuria or gross hematuria, no abnormal uterine bleeding or disharge Musculoskeletal: negative for new gait disturbance or muscular weakness Integumentary: negative for new or persistent rashes, no breast lumps Neurological: negative for TIA or stroke symptoms Psychiatric: negative for SI or delusions Allergic/Immunologic: negative for hives  Patient Care Team    Relationship Specialty Notifications Start End  Leamon Arnt, MD PCP - General Family Medicine  01/10/18     Objective  Vitals: BP (!) 139/94    Pulse 72    Temp 98.1 F (36.7 C) (Temporal)    Ht 5\' 3"  (1.6 m)    Wt 137 lb 9.6 oz (62.4 kg)    LMP 02/12/2021 (Approximate)    SpO2 99%    BMI 24.37 kg/m  General:  Well developed, well nourished, no acute distress  Psych:  Alert and orientedx3,normal mood and affect HEENT:  Normocephalic, atraumatic, non-icteric sclera,  supple neck without adenopathy, mass or thyromegaly Cardiovascular:  Normal S1, S2, RRR without gallop, rub or murmur Respiratory:  Good breath sounds  bilaterally, CTAB with normal respiratory effort Gastrointestinal: normal bowel sounds, soft, non-tender, no noted masses. No HSM MSK: no deformities, contusions. Joints are without erythema or swelling.  Skin:  Warm, no rashes or suspicious lesions noted Neurologic:    Mental status is normal. CN 2-11 are normal. Gross motor and sensory exams are normal. Normal gait. No tremor Breast Exam: No mass, skin retraction or nipple discharge is appreciated in either breast. No axillary adenopathy. Fibrocystic changes are not noted   Commons side effects, risks, benefits, and alternatives for medications and treatment plan prescribed today were discussed, and the patient expressed understanding of the given instructions. Patient is instructed to call or message via MyChart if he/she has any questions or concerns regarding our treatment plan. No barriers to understanding were identified. We discussed Red Flag symptoms and signs in detail. Patient expressed understanding regarding what to do in case of urgent or emergency type symptoms.  Medication list was reconciled, printed and provided to the patient in AVS. Patient instructions and summary information was reviewed with the patient as documented in the AVS. This note was prepared with assistance of Dragon voice recognition  software. Occasional wrong-word or sound-a-like substitutions may have occurred due to the inherent limitations of voice recognition software  This visit occurred during the SARS-CoV-2 public health emergency.  Safety protocols were in place, including screening questions prior to the visit, additional usage of staff PPE, and extensive cleaning of exam room while observing appropriate contact time as indicated for disinfecting solutions.

## 2021-05-12 NOTE — Patient Instructions (Signed)
Please return in 3 months to recheck blood pressure.   I will release your lab results to you on your MyChart account with further instructions. Please reply with any questions.    Please start taking the new blood pressure pill daily.   I recommend the [facility] test for your colon cancer screening that is due. I have ordered this test for you. The [facility] will soon contact you to verify your insurance, address etc. They will then send you the kit; follow the instructions in the kit and return the kit to [facility]. They will run the test and send the results to me. I will then give you the results. If this test is negative, we recommend repeating a colon cancer screening test in 3 years. If it is positive, I will refer you to a Gastroenterologist so you can get set up for the recommended colonoscopy.  Thank you!   Please have Dr. Kennith Maes office send [place] his notes and results when you see him. Thanks.   If you have any questions or concerns, please don't hesitate to send me a message via MyChart or call the office at [phone]. Thank you for visiting with [place] today! It's our pleasure caring for you.   Hypertension, Adult High blood pressure (hypertension) is when the force of blood pumping through the arteries is too strong. The arteries are the blood vessels that carry blood from the heart throughout the body. Hypertension forces the heart to work harder to pump blood and may cause arteries to become narrow or stiff. Untreated or uncontrolled hypertension can cause a heart attack, heart failure, a stroke, kidney disease, and other problems. A blood pressure reading consists of a higher number over a lower number. Ideally, your blood pressure should be below 120/80. The first ("top") number is called the systolic pressure. It is a measure of the pressure in your arteries as your heart beats. The second ("bottom") number is called the diastolic pressure. It is a measure of the pressure in  your arteries as the heart relaxes. What are the causes? The exact cause of this condition is not known. There are some conditions that result in or are related to high blood pressure. What increases the risk? Some risk factors for high blood pressure are under your control. The following factors may make you more likely to develop this condition: Smoking. Having type 2 diabetes mellitus, high cholesterol, or both. Not getting enough exercise or physical activity. Being overweight. Having too much fat, sugar, calories, or salt (sodium) in your diet. Drinking too much alcohol. Some risk factors for high blood pressure may be difficult or impossible to change. Some of these factors include: Having chronic kidney disease. Having a family history of high blood pressure. Age. Risk increases with age. Race. You may be at higher risk if you are African American. Gender. Men are at higher risk than women before age 11. After age 83, women are at higher risk than men. Having obstructive sleep apnea. Stress. What are the signs or symptoms? High blood pressure may not cause symptoms. Very high blood pressure (hypertensive crisis) may cause: Headache. Anxiety. Shortness of breath. Nosebleed. Nausea and vomiting. Vision changes. Severe chest pain. Seizures. How is this diagnosed? This condition is diagnosed by measuring your blood pressure while you are seated, with your arm resting on a flat surface, your legs uncrossed, and your feet flat on the floor. The cuff of the blood pressure monitor will be placed directly against the skin  of your upper arm at the level of your heart. It should be measured at least twice using the same arm. Certain conditions can cause a difference in blood pressure between your right and left arms. Certain factors can cause blood pressure readings to be lower or higher than normal for a short period of time: When your blood pressure is higher when you are in a health  care provider's office than when you are at home, this is called white coat hypertension. Most people with this condition do not need medicines. When your blood pressure is higher at home than when you are in a health care provider's office, this is called masked hypertension. Most people with this condition may need medicines to control blood pressure. If you have a high blood pressure reading during one visit or you have normal blood pressure with other risk factors, you may be asked to: Return on a different day to have your blood pressure checked again. Monitor your blood pressure at home for 1 week or longer. If you are diagnosed with hypertension, you may have other blood or imaging tests to help your health care provider understand your overall risk for other conditions. How is this treated? This condition is treated by making healthy lifestyle changes, such as eating healthy foods, exercising more, and reducing your alcohol intake. Your health care provider may prescribe medicine if lifestyle changes are not enough to get your blood pressure under control, and if: Your systolic blood pressure is above 130. Your diastolic blood pressure is above 80. Your personal target blood pressure may vary depending on your medical conditions, your age, and other factors. Follow these instructions at home: Eating and drinking  Eat a diet that is high in fiber and potassium, and low in sodium, added sugar, and fat. An example eating plan is called the DASH (Dietary Approaches to Stop Hypertension) diet. To eat this way: Eat plenty of fresh fruits and vegetables. Try to fill one half of your plate at each meal with fruits and vegetables. Eat whole grains, such as whole-wheat pasta, brown rice, or whole-grain bread. Fill about one fourth of your plate with whole grains. Eat or drink low-fat dairy products, such as skim milk or low-fat yogurt. Avoid fatty cuts of meat, processed or cured meats, and poultry  with skin. Fill about one fourth of your plate with lean proteins, such as fish, chicken without skin, beans, eggs, or tofu. Avoid pre-made and processed foods. These tend to be higher in sodium, added sugar, and fat. Reduce your daily sodium intake. Most people with hypertension should eat less than 1,500 mg of sodium a day. Do not drink alcohol if: Your health care provider tells you not to drink. You are pregnant, may be pregnant, or are planning to become pregnant. If you drink alcohol: Limit how much you use to: 0-1 drink a day for women. 0-2 drinks a day for men. Be aware of how much alcohol is in your drink. In the [place] one drink equals one 12 oz bottle of beer (355 mL), one 5 oz glass of wine (148 mL), or one 1 oz glass of hard liquor (44 mL). Lifestyle  Work with your health care provider to maintain a healthy body weight or to lose weight. Ask what an ideal weight is for you. Get at least 30 minutes of exercise most days of the week. Activities may include walking, swimming, or biking. Include exercise to strengthen your muscles (resistance exercise), such as Pilates or  lifting weights, as part of your weekly exercise routine. Try to do these types of exercises for 30 minutes at least 3 days a week. Do not use any products that contain nicotine or tobacco, such as cigarettes, e-cigarettes, and chewing tobacco. If you need help quitting, ask your health care provider. Monitor your blood pressure at home as told by your health care provider. Keep all follow-up visits as told by your health care provider. This is important. Medicines Take over-the-counter and prescription medicines only as told by your health care provider. Follow directions carefully. Blood pressure medicines must be taken as prescribed. Do not skip doses of blood pressure medicine. Doing this puts you at risk for problems and can make the medicine less effective. Ask your health care provider about side effects or  reactions to medicines that you should watch for. Contact a health care provider if you: Think you are having a reaction to a medicine you are taking. Have headaches that keep coming back (recurring). Feel dizzy. Have swelling in your ankles. Have trouble with your vision. Get help right away if you: Develop a severe headache or confusion. Have unusual weakness or numbness. Feel faint. Have severe pain in your chest or abdomen. Vomit repeatedly. Have trouble breathing. Summary Hypertension is when the force of blood pumping through your arteries is too strong. If this condition is not controlled, it may put you at risk for serious complications. Your personal target blood pressure may vary depending on your medical conditions, your age, and other factors. For most people, a normal blood pressure is less than 120/80. Hypertension is treated with lifestyle changes, medicines, or a combination of both. Lifestyle changes include losing weight, eating a healthy, low-sodium diet, exercising more, and limiting alcohol. This information is not intended to replace advice given to you by your health care provider. Make sure you discuss any questions you have with your health care provider. Document Revised: 12/29/2017 Document Reviewed: 12/29/2017 Elsevier Patient Education  [place].

## 2021-05-13 LAB — COMPREHENSIVE METABOLIC PANEL
ALT: 17 U/L (ref 0–35)
AST: 19 U/L (ref 0–37)
Albumin: 4.3 g/dL (ref 3.5–5.2)
Alkaline Phosphatase: 62 U/L (ref 39–117)
BUN: 16 mg/dL (ref 6–23)
CO2: 23 mEq/L (ref 19–32)
Calcium: 9.2 mg/dL (ref 8.4–10.5)
Chloride: 103 mEq/L (ref 96–112)
Creatinine, Ser: 0.66 mg/dL (ref 0.40–1.20)
GFR: 102.54 mL/min (ref >60.00)
Glucose, Bld: 96 mg/dL (ref 70–99)
Potassium: 3.8 mEq/L (ref 3.5–5.1)
Sodium: 134 mEq/L — ABNORMAL LOW (ref 135–145)
Total Bilirubin: 0.4 mg/dL (ref 0.2–1.2)
Total Protein: 7 g/dL (ref 6.0–8.3)

## 2021-05-13 LAB — CBC WITH DIFFERENTIAL/PLATELET
Basophils Absolute: 0 10*3/uL (ref 0.0–0.1)
Basophils Relative: 0.7 % (ref 0.0–3.0)
Eosinophils Absolute: 0.1 10*3/uL (ref 0.0–0.7)
Eosinophils Relative: 1.6 % (ref 0.0–5.0)
HCT: 41.4 % (ref 36.0–46.0)
Hemoglobin: 13.6 g/dL (ref 12.0–15.0)
Lymphocytes Relative: 24.7 % (ref 12.0–46.0)
Lymphs Abs: 1.2 10*3/uL (ref 0.7–4.0)
MCHC: 32.8 g/dL (ref 30.0–36.0)
MCV: 88.3 fl (ref 78.0–100.0)
Monocytes Absolute: 0.4 10*3/uL (ref 0.1–1.0)
Monocytes Relative: 7.5 % (ref 3.0–12.0)
Neutro Abs: 3.2 10*3/uL (ref 1.4–7.7)
Neutrophils Relative %: 65.5 % (ref 43.0–77.0)
Platelets: 157 10*3/uL (ref 150.0–400.0)
RBC: 4.69 Mil/uL (ref 3.87–5.11)
RDW: 13.7 % (ref 11.5–15.5)
WBC: 4.9 10*3/uL (ref 4.0–10.5)

## 2021-05-13 LAB — LIPID PANEL
Cholesterol: 207 mg/dL — ABNORMAL HIGH (ref 0–200)
HDL: 65.8 mg/dL (ref >39.00)
LDL Cholesterol: 104 mg/dL — ABNORMAL HIGH (ref 0–99)
NonHDL: 141.01
Total CHOL/HDL Ratio: 3
Triglycerides: 183 mg/dL — ABNORMAL HIGH (ref 0.0–149.0)
VLDL: 36.6 mg/dL (ref 0.0–40.0)

## 2021-05-13 LAB — HEPATITIS C ANTIBODY
Hepatitis C Ab: NONREACTIVE
SIGNAL TO CUT-OFF: 0.14 (ref <1.00)

## 2021-05-16 ENCOUNTER — Other Ambulatory Visit: Payer: Self-pay

## 2021-05-16 ENCOUNTER — Telehealth: Payer: Self-pay | Admitting: Family Medicine

## 2021-05-16 MED ORDER — HYDROCHLOROTHIAZIDE 12.5 MG PO CAPS: 12.5000 mg | ORAL_CAPSULE | Freq: Every day | ORAL | 3 refills | Status: DC

## 2021-05-16 NOTE — Telephone Encounter (Signed)
Pt called to have her refill of Hydrochlorothiazide 12.5mg  capsules be sent to [facility] [phone] at [facility], [place] [address]. It was sent to the wrong pharmacy.   She also would like a letter stating she has carsickness for her job. Please call when the letter is ready.

## 2021-05-16 NOTE — Telephone Encounter (Signed)
Medication has been filled. Please advise, will let patient know PCP is out of office for a week.

## 2021-05-26 NOTE — Telephone Encounter (Signed)
Pt would like a note for her job for motion sickness. Will pick up note tomorrow.

## 2021-05-27 ENCOUNTER — Encounter: Payer: Self-pay | Admitting: Family Medicine

## 2022-01-26 ENCOUNTER — Encounter: Payer: Self-pay | Admitting: *Deleted

## 2022-04-16 ENCOUNTER — Encounter: Payer: Self-pay | Admitting: *Deleted

## 2022-11-12 LAB — HM PAP SMEAR

## 2022-11-12 LAB — HM MAMMOGRAPHY

## 2022-12-18 LAB — EXTERNAL GENERIC LAB PROCEDURE: COLOGUARD: NEGATIVE

## 2022-12-18 LAB — COLOGUARD: COLOGUARD: NEGATIVE

## 2023-01-18 NOTE — Progress Notes (Unsigned)
Patient ID: Morgan Conley, female    DOB: 02/02/1971, 52 y.o.   MRN: [id number]  No chief complaint on file.   HPI: Hypertension: Patient is currently maintained on the following medications for blood pressure: hx of HCTZ. Failed meds include: *** Patient reports good compliance with blood pressure medications. Patient denies chest pain, headaches, shortness of breath or swelling. Last 3 blood pressure readings in our office are as follows: BP Readings from Last 3 Encounters:  05/12/21 (!) 139/94  01/07/18 118/80  12/14/17 122/82     Assessment & Plan:  There are no diagnoses linked to this encounter.  Subjective:    Outpatient Medications Prior to Visit  Medication Sig Dispense Refill   hydrochlorothiazide (MICROZIDE) 12.5 MG capsule Take 1 capsule (12.5 mg total) by mouth daily. 90 capsule 3   No facility-administered medications prior to visit.   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  Constitutional:      Appearance: Normal appearance.  Cardiovascular:     Rate and Rhythm: Normal rate and regular rhythm.  Pulmonary:     Effort: Pulmonary effort is normal.     Breath sounds: Normal breath sounds.  Musculoskeletal:        General: Normal range of motion.  Skin:    General: Skin is warm and dry.  Neurological:     Mental Status: She is alert.  Psychiatric:        Mood and Affect: Mood normal.        Behavior: Behavior normal.    There were no vitals taken for this visit. Wt Readings from Last 3 Encounters:  05/12/21 137 lb 9.6 oz (62.4 kg)  01/07/18 124 lb 12.8 oz (56.6 kg)  12/14/17 125 lb 12.8 oz (57.1 kg)       Dulce Sellar, NP

## 2023-01-19 ENCOUNTER — Ambulatory Visit: Payer: Managed Care, Other (non HMO) | Admitting: Family

## 2023-01-19 VITALS — BP 144/92 | HR 62 | Temp 98.0°F | Ht 63.0 in | Wt 129.4 lb

## 2023-01-19 DIAGNOSIS — I10 Essential (primary) hypertension: Secondary | ICD-10-CM

## 2023-01-19 MED ORDER — LOSARTAN POTASSIUM 25 MG PO TABS: 25.0000 mg | ORAL_TABLET | Freq: Two times a day (BID) | ORAL | 1 refills | Status: DC

## 2023-01-29 ENCOUNTER — Ambulatory Visit: Payer: Managed Care, Other (non HMO) | Admitting: Family Medicine

## 2023-02-09 LAB — BASIC METABOLIC PANEL
CO2: 21 (ref 13–22)
Creatinine: 0.8 (ref 0.5–1.1)
Glucose: 101
Glucose: 105
Potassium: 4 meq/L (ref 3.5–5.1)
Sodium: 140 (ref 137–147)

## 2023-02-09 LAB — LIPID PANEL
Cholesterol: 206 — AB (ref 0–200)
Cholesterol: 208 — AB (ref 0–200)
HDL: 63 (ref 35–70)
HDL: 67 (ref 35–70)
LDL Cholesterol: 119
LDL Cholesterol: 121
Triglycerides: 111 (ref 40–160)
Triglycerides: 139 (ref 40–160)

## 2023-02-09 LAB — CBC AND DIFFERENTIAL
Hemoglobin: 13.9 (ref 12.0–16.0)
Platelets: 203 10*3/uL (ref 150–400)
WBC: 4.3

## 2023-02-09 LAB — HEPATIC FUNCTION PANEL
ALT: 18 U/L (ref 7–35)
AST: 21 (ref 13–35)
Alkaline Phosphatase: 88 (ref 25–125)
Bilirubin, Total: 0.2

## 2023-02-09 LAB — COMPREHENSIVE METABOLIC PANEL
Calcium: 9.3 (ref 8.7–10.7)
eGFR: 95

## 2023-02-09 LAB — VITAMIN D 25 HYDROXY (VIT D DEFICIENCY, FRACTURES)
Vit D, 25-Hydroxy: 23.3
Vit D, 25-Hydroxy: 23.8

## 2023-02-09 LAB — HEMOGLOBIN A1C: Hemoglobin A1C: 5.8

## 2023-02-09 LAB — TSH: TSH: 1.5 (ref 0.41–5.90)

## 2023-03-04 ENCOUNTER — Encounter: Payer: Self-pay | Admitting: Family Medicine

## 2023-03-04 ENCOUNTER — Ambulatory Visit: Payer: Managed Care, Other (non HMO) | Admitting: Family Medicine

## 2023-03-04 VITALS — BP 146/92 | HR 82 | Temp 98.1°F | Ht 63.0 in | Wt 132.0 lb

## 2023-03-04 DIAGNOSIS — L819 Disorder of pigmentation, unspecified: Secondary | ICD-10-CM

## 2023-03-04 DIAGNOSIS — J301 Allergic rhinitis due to pollen: Secondary | ICD-10-CM | POA: Diagnosis not present

## 2023-03-04 DIAGNOSIS — Z0001 Encounter for general adult medical examination with abnormal findings: Secondary | ICD-10-CM

## 2023-03-04 DIAGNOSIS — I10 Essential (primary) hypertension: Secondary | ICD-10-CM | POA: Diagnosis not present

## 2023-03-04 MED ORDER — CHLORTHALIDONE 25 MG PO TABS: 25.0000 mg | ORAL_TABLET | Freq: Every day | ORAL | 3 refills | Status: DC

## 2023-03-04 MED ORDER — CETIRIZINE HCL 10 MG PO TABS: 10.0000 mg | ORAL_TABLET | Freq: Every day | ORAL | Status: AC

## 2023-03-04 MED ORDER — TRIAMCINOLONE ACETONIDE 0.1 % EX CREA: 1.0000 | TOPICAL_CREAM | Freq: Two times a day (BID) | CUTANEOUS | 0 refills | Status: DC

## 2023-03-04 NOTE — Patient Instructions (Addendum)
Please return in 6-8 weeks to recheck blood pressure.  Please bring in your home blood pressure readings and your blood pressure machine.  Take OTC Zyrtec (or cetirizine,generic) nightly to help with your allergies. Do not take phenylephrine or decongestants as they can raise your blood pressure   Please upload your recent lab results to your mychart account for my review.   If you have any questions or concerns, please don't hesitate to send me a message via MyChart or call the office at [phone]. Thank you for visiting with [place] today! It's our pleasure caring for you.   Hypertension, Adult High blood pressure (hypertension) is when the force of blood pumping through the arteries is too strong. The arteries are the blood vessels that carry blood from the heart throughout the body. Hypertension forces the heart to work harder to pump blood and may cause arteries to become narrow or stiff. Untreated or uncontrolled hypertension can lead to a heart attack, heart failure, a stroke, kidney disease, and other problems. A blood pressure reading consists of a higher number over a lower number. Ideally, your blood pressure should be below 120/80. The first ("top") number is called the systolic pressure. It is a measure of the pressure in your arteries as your heart beats. The second ("bottom") number is called the diastolic pressure. It is a measure of the pressure in your arteries as the heart relaxes. What are the causes? The exact cause of this condition is not known. There are some conditions that result in high blood pressure. What increases the risk? Certain factors may make you more likely to develop high blood pressure. Some of these risk factors are under your control, including: Smoking. Not getting enough exercise or physical activity. Being overweight. Having too much fat, sugar, calories, or salt (sodium) in your diet. Drinking too much alcohol. Other risk factors include: Having a  personal history of heart disease, diabetes, high cholesterol, or kidney disease. Stress. Having a family history of high blood pressure and high cholesterol. Having obstructive sleep apnea. Age. The risk increases with age. What are the signs or symptoms? High blood pressure may not cause symptoms. Very high blood pressure (hypertensive crisis) may cause: Headache. Fast or irregular heartbeats (palpitations). Shortness of breath. Nosebleed. Nausea and vomiting. Vision changes. Severe chest pain, dizziness, and seizures. How is this diagnosed? This condition is diagnosed by measuring your blood pressure while you are seated, with your arm resting on a flat surface, your legs uncrossed, and your feet flat on the floor. The cuff of the blood pressure monitor will be placed directly against the skin of your upper arm at the level of your heart. Blood pressure should be measured at least twice using the same arm. Certain conditions can cause a difference in blood pressure between your right and left arms. If you have a high blood pressure reading during one visit or you have normal blood pressure with other risk factors, you may be asked to: Return on a different day to have your blood pressure checked again. Monitor your blood pressure at home for 1 week or longer. If you are diagnosed with hypertension, you may have other blood or imaging tests to help your health care provider understand your overall risk for other conditions. How is this treated? This condition is treated by making healthy lifestyle changes, such as eating healthy foods, exercising more, and reducing your alcohol intake. You may be referred for counseling on a healthy diet and physical activity. Your  health care provider may prescribe medicine if lifestyle changes are not enough to get your blood pressure under control and if: Your systolic blood pressure is above 130. Your diastolic blood pressure is above 80. Your personal  target blood pressure may vary depending on your medical conditions, your age, and other factors. Follow these instructions at home: Eating and drinking  Eat a diet that is high in fiber and potassium, and low in sodium, added sugar, and fat. An example of this eating plan is called the DASH diet. DASH stands for Dietary Approaches to Stop Hypertension. To eat this way: Eat plenty of fresh fruits and vegetables. Try to fill one half of your plate at each meal with fruits and vegetables. Eat whole grains, such as whole-wheat pasta, brown rice, or whole-grain bread. Fill about one fourth of your plate with whole grains. Eat or drink low-fat dairy products, such as skim milk or low-fat yogurt. Avoid fatty cuts of meat, processed or cured meats, and poultry with skin. Fill about one fourth of your plate with lean proteins, such as fish, chicken without skin, beans, eggs, or tofu. Avoid pre-made and processed foods. These tend to be higher in sodium, added sugar, and fat. Reduce your daily sodium intake. Many people with hypertension should eat less than 1,500 mg of sodium a day. Do not drink alcohol if: Your health care provider tells you not to drink. You are pregnant, may be pregnant, or are planning to become pregnant. If you drink alcohol: Limit how much you have to: 0-1 drink a day for women. 0-2 drinks a day for men. Know how much alcohol is in your drink. In the [place] one drink equals one 12 oz bottle of beer (355 mL), one 5 oz glass of wine (148 mL), or one 1 oz glass of hard liquor (44 mL). Lifestyle  Work with your health care provider to maintain a healthy body weight or to lose weight. Ask what an ideal weight is for you. Get at least 30 minutes of exercise that causes your heart to beat faster (aerobic exercise) most days of the week. Activities may include walking, swimming, or biking. Include exercise to strengthen your muscles (resistance exercise), such as Pilates or lifting  weights, as part of your weekly exercise routine. Try to do these types of exercises for 30 minutes at least 3 days a week. Do not use any products that contain nicotine or tobacco. These products include cigarettes, chewing tobacco, and vaping devices, such as e-cigarettes. If you need help quitting, ask your health care provider. Monitor your blood pressure at home as told by your health care provider. Keep all follow-up visits. This is important. Medicines Take over-the-counter and prescription medicines only as told by your health care provider. Follow directions carefully. Blood pressure medicines must be taken as prescribed. Do not skip doses of blood pressure medicine. Doing this puts you at risk for problems and can make the medicine less effective. Ask your health care provider about side effects or reactions to medicines that you should watch for. Contact a health care provider if you: Think you are having a reaction to a medicine you are taking. Have headaches that keep coming back (recurring). Feel dizzy. Have swelling in your ankles. Have trouble with your vision. Get help right away if you: Develop a severe headache or confusion. Have unusual weakness or numbness. Feel faint. Have severe pain in your chest or abdomen. Vomit repeatedly. Have trouble breathing. These symptoms may be an  emergency. Get help right away. Call 911. Do not wait to see if the symptoms will go away. Do not drive yourself to the hospital. Summary Hypertension is when the force of blood pumping through your arteries is too strong. If this condition is not controlled, it may put you at risk for serious complications. Your personal target blood pressure may vary depending on your medical conditions, your age, and other factors. For most people, a normal blood pressure is less than 120/80. Hypertension is treated with lifestyle changes, medicines, or a combination of both. Lifestyle changes include losing  weight, eating a healthy, low-sodium diet, exercising more, and limiting alcohol. This information is not intended to replace advice given to you by your health care provider. Make sure you discuss any questions you have with your health care provider. Document Revised: 02/25/2021 Document Reviewed: 02/25/2021 Elsevier Patient Education  2024 [employer].

## 2023-03-04 NOTE — Progress Notes (Signed)
Subjective  Chief Complaint  Patient presents with   Annual Exam    Pt here for Annual exam and is currently fasting. Pt stated that she has not been taking the 2 bp medication bc she felt that they were not working     HPI: Morgan Conley is a 52 y.o. female who presents to [facility] [place] today for a Female Wellness Visit. She also has the concerns and/or needs as listed above in the chief complaint. These will be addressed in addition to the Health Maintenance Visit.   Wellness Visit: annual visit with health maintenance review and exam  HM: sees Dr. Billy Coast for female wellness. Verified nl pap and mammo, current. Neg cologuard 2023. Healthy lifestyle, married mother of 2 (15 and 21) works full time. Stressful job. Eligible for shingrix and possibly Tdap. Declines flu vaccine Had fasting labs drawn at work: will upload. Reports high normal A1c, normal TSH, borderline cholesterol and nl other labs.  Chronic disease f/u and/or acute problem visit: (deemed necessary to be done in addition to the wellness visit): HTN: first diagnosed 05/2021 and low dose hydrochlorothiazide started. Did not return for f/u. In September had headache and elevated BP; had urgent care eval and OV here. I reviewed notes. Losartan 25 bid and hydrochlorothiazide 12.5 ordered. Pt took for one month only then stopped. Reports BP remains borderline high. No cp or sob or edema. No palpitations.  Chronic allergies worse in fall and spring: took otc sinus/allergy med with phenylephrine to try to help the sxs and headache. No sinus pain or fevers.  Has small quarter sized dark patch on right lower abdomen. Reports it has been there for years and at times, itches. No spreading or changing.   Assessment  1. Encounter for well adult exam with abnormal findings   2. Essential hypertension   3. Seasonal allergic rhinitis due to pollen   4. Hyperpigmentation      Plan  Female Wellness Visit: Age appropriate  Health Maintenance and Prevention measures were discussed with patient. Included topics are cancer screening recommendations, ways to keep healthy (see AVS) including dietary and exercise recommendations, regular eye and dental care, use of seat belts, and avoidance of moderate alcohol use and tobacco use. Screens are up to date BMI: discussed patient's BMI and encouraged positive lifestyle modifications to help get to or maintain a target BMI. HM needs and immunizations were addressed and ordered. See below for orders. See HM and immunization section for updates. Declines flu vaccine. Will get 1st Shingrix at next visit. Will see when she had last tdap Routine labs and screening tests ordered including cmp, cbc and lipids where appropriate. Discussed recommendations regarding Vit D and calcium supplementation (see AVS)  Chronic disease management visit and/or acute problem visit: HTN: had discussion w/ patient on etiology, prognosis and treatment goals of chronic hypertension. Start chlorthalidone 25 daily and recheck in office in 4-6 weeks, bring home readings and bp cuff. Will recheck bmp at that time. Pt to bring in recent lab work for my review as well. Allergies. Avoid decongestants. Start zyrtec and flonase if needed. Education given.  Hyperpigmented rash: appears to be chronic and possibly reactive. Trial of triamcinolone cream to lighten it. No atypical features.   Follow up: 4-6 weeks to recheck htn w/ bmp on meds  No orders of the defined types were placed in this encounter.  Meds ordered this encounter  Medications   chlorthalidone (HYGROTON) 25 MG tablet  Sig: Take 1 tablet (25 mg total) by mouth daily.    Dispense:  90 tablet    Refill:  3   triamcinolone cream (KENALOG) 0.1 %    Sig: Apply 1 Application topically 2 (two) times daily. For 2 weeks, then as needed    Dispense:  45 g    Refill:  0   cetirizine (ZYRTEC) 10 MG tablet    Sig: Take 1 tablet (10 mg total) by mouth  daily.      Body mass index is 23.38 kg/m. 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    Allergic rhinitis 10/15/2012    Health Maintenance  Topic Date Due   DTaP/Tdap/Td (1 - Tdap) Never done   Zoster Vaccines- Shingrix (1 of 2) Never done   COVID-19 Vaccine (3 - 2023-24 season) 03/20/2023 (Originally 01/03/2023)   INFLUENZA VACCINE  08/02/2023 (Originally 12/03/2022)   MAMMOGRAM  11/12/2023   Fecal DNA (Cologuard)  12/11/2025   Cervical Cancer Screening (HPV/Pap Cotest)  11/12/2027   Hepatitis C Screening  Completed   HIV Screening  Completed   HPV VACCINES  Aged Out   Immunization History  Administered Date(s) Administered   Influenza,inj,Quad PF,6+ Mos 05/12/2021   PFIZER(Purple Top)SARS-COV-2 Vaccination 09/09/2019, 10/03/2019   We updated and reviewed the patient's past history in detail and it is documented below. Allergies: Patient has No Known Allergies. Past Medical History Patient  has a past medical history of Allergy and Recurrent cold sores (12/14/2017). Past Surgical History Patient  has a past surgical history that includes Cesarean section (2003, 2011). Family History: Patient family history includes Hypertension in her father. Social History:  Patient  reports that she has never smoked. She has never used smokeless tobacco. She reports current alcohol use. She reports that she does not use drugs.  Review of Systems: Constitutional: negative for fever or malaise Ophthalmic: negative for photophobia, double vision or loss of vision Cardiovascular: negative for chest pain, dyspnea on exertion, or new LE swelling Respiratory: negative for SOB or persistent cough Gastrointestinal: negative for abdominal pain, change in bowel habits or melena Genitourinary: negative  for dysuria or gross hematuria, no abnormal uterine bleeding or disharge Musculoskeletal: negative for new gait disturbance or muscular weakness Integumentary: negative for new or persistent rashes, no breast lumps Neurological: negative for TIA or stroke symptoms Psychiatric: negative for SI or delusions Allergic/Immunologic: negative for hives  Patient Care Team    Relationship Specialty Notifications Start End  Willow Ora, MD PCP - General Family Medicine  01/10/18   [facility] Consulting Physician Obstetrics and Gynecology  03/04/23 03/04/23  Olivia Mackie, MD Consulting Physician Obstetrics and Gynecology  03/04/23     Objective  Vitals: BP (!) 146/92   Pulse 82   Temp 98.1 F (36.7 C)   Ht 5\' 3"  (1.6 m)   Wt 132 lb (59.9 kg)   SpO2 98%   BMI 23.38 kg/m  General:  Well developed, well nourished, no acute distress  Psych:  Alert and orientedx3,normal mood and affect HEENT:  Normocephalic, atraumatic, non-icteric sclera,  supple neck without adenopathy, mass or thyromegaly Cardiovascular:  Normal S1, S2, RRR without gallop, rub or murmur Respiratory:  Good breath sounds bilaterally, CTAB with normal respiratory effort Gastrointestinal: normal  bowel sounds, soft, non-tender, no noted masses. No HSM MSK: extremities without edema, joints without erythema or swelling Skin: right lower abdomen: quarter annular brown patch w/o flaking. Regular borders, homogenous Neurologic:    Mental status is normal.  Gross motor and sensory exams are normal.  No tremor  Commons side effects, risks, benefits, and alternatives for medications and treatment plan prescribed today were discussed, and the patient expressed understanding of the given instructions. Patient is instructed to call or message via MyChart if he/she has any questions or concerns regarding our treatment plan. No barriers to understanding were identified. We discussed Red Flag symptoms and signs in detail. Patient expressed  understanding regarding what to do in case of urgent or emergency type symptoms.  Medication list was reconciled, printed and provided to the patient in AVS. Patient instructions and summary information was reviewed with the patient as documented in the AVS. This note was prepared with assistance of Dragon voice recognition software. Occasional wrong-word or sound-a-like substitutions may have occurred due to the inherent limitations of voice recognition software

## 2023-04-16 ENCOUNTER — Encounter: Payer: Self-pay | Admitting: Family Medicine

## 2023-04-16 ENCOUNTER — Ambulatory Visit: Payer: Managed Care, Other (non HMO) | Admitting: Family Medicine

## 2023-04-16 VITALS — BP 122/84 | HR 83 | Temp 98.4°F | Ht 63.0 in | Wt 126.0 lb

## 2023-04-16 DIAGNOSIS — Z23 Encounter for immunization: Secondary | ICD-10-CM

## 2023-04-16 DIAGNOSIS — I10 Essential (primary) hypertension: Secondary | ICD-10-CM | POA: Diagnosis not present

## 2023-04-16 DIAGNOSIS — R7301 Impaired fasting glucose: Secondary | ICD-10-CM | POA: Diagnosis not present

## 2023-04-16 LAB — BASIC METABOLIC PANEL
BUN: 9 mg/dL (ref 6–23)
CO2: 31 meq/L (ref 19–32)
Calcium: 10.1 mg/dL (ref 8.4–10.5)
Chloride: 96 meq/L (ref 96–112)
Creatinine, Ser: 0.61 mg/dL (ref 0.40–1.20)
GFR: 103.1 mL/min (ref >60.00)
Glucose, Bld: 95 mg/dL (ref 70–99)
Potassium: 3.3 meq/L — ABNORMAL LOW (ref 3.5–5.1)
Sodium: 139 meq/L (ref 135–145)

## 2023-04-16 NOTE — Progress Notes (Signed)
Subjective  CC:  Chief Complaint  Patient presents with   Hypertension    HPI: Morgan Conley is a 52 y.o. female who presents to the office today to address the problems listed above in the chief complaint. Hypertension f/u: Started on chlorthalidone 25 mg daily 6 weeks ago.  Home blood pressure readings are improved.  Reports systolic numbers averaging 120s.  Diastolics typically in the 80s but has been as high as the low 90s over the last few weeks, feeling particularly stressed and overwhelmed from work.  No chest pain but does have an occasional feeling of racing of the heart or palpitation.  She brings in lab work from her office showing normal renal function, potassium of 4.0, normal TSH. Impaired fasting glucose: Fasting sugar 105 and A1c 5.8.  Eats a healthy diet. Health maintenance: Eligible for shingles and Tdap today.  Assessment  1. Essential hypertension   2. Need for shingles vaccine   3. IFG (impaired fasting glucose)   4. Need for Tdap vaccination      Plan   Hypertension f/u: BP control is fairly well controlled.  Will recheck BMP and monitor potassium today.  She will continue home readings.  Can consider adjusting medications to try to get diastolics a little lower.  Could consider low-dose beta-blocker given palpitations, or adding ARB and cutting back on chlorthalidone dose.  I will recheck patient once I get her lab work back.  She will continue home monitoring. Impaired fasting glucose noted.  Low sugar diet recommended. Shingrix No. 1 given today.  Tdap booster updated today.  Education regarding management of these chronic disease states was given. Management strategies discussed on successive visits include dietary and exercise recommendations, goals of achieving and maintaining IBW, and lifestyle modifications aiming for adequate sleep and minimizing stressors.   Follow up: 3 months to recheck blood pressure  Orders Placed This Encounter  Procedures   Zoster  Recombinant (Shingrix )   Tdap vaccine greater than or equal to 7yo IM   Basic metabolic panel   No orders of the defined types were placed in this encounter.     BP Readings from Last 3 Encounters:  04/16/23 122/84  03/04/23 (!) 146/92  01/19/23 (!) 144/92   Wt Readings from Last 3 Encounters:  04/16/23 126 lb (57.2 kg)  03/04/23 132 lb (59.9 kg)  01/19/23 129 lb 6.4 oz (58.7 kg)    Lab Results  Component Value Date   CHOL 207 (H) 05/12/2021   Lab Results  Component Value Date   HDL 65.80 05/12/2021   Lab Results  Component Value Date   LDLCALC 104 (H) 05/12/2021   Lab Results  Component Value Date   TRIG 183.0 (H) 05/12/2021   Lab Results  Component Value Date   CHOLHDL 3 05/12/2021   No results found for: "LDLDIRECT" Lab Results  Component Value Date   CREATININE 0.61 04/16/2023   BUN 9 04/16/2023   NA 139 04/16/2023   K 3.3 (L) 04/16/2023   CL 96 04/16/2023   CO2 31 04/16/2023    The 10-year ASCVD risk score (Arnett DK, et al., 2019) is: 1.4%   Values used to calculate the score:     Age: 81 years     Sex: Female     Is Non-Hispanic African American: No     Diabetic: No     Tobacco smoker: No     Systolic Blood Pressure: 122 mmHg     Is BP treated: Yes  HDL Cholesterol: 65.8 mg/dL     Total Cholesterol: 207 mg/dL  I reviewed the patients updated PMH, FH, and SocHx.    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  Social History: Patient  reports that she has never smoked. She has never used smokeless tobacco. She reports current alcohol use. She reports that she does not use drugs.  Current Meds  Medication Sig   cetirizine (ZYRTEC) 10 MG tablet Take 1 tablet (10  mg total) by mouth daily.   chlorthalidone (HYGROTON) 25 MG tablet Take 1 tablet (25 mg total) by mouth daily.   triamcinolone cream (KENALOG) 0.1 % Apply 1 Application topically 2 (two) times daily. For 2 weeks, then as needed    Review of Systems: Cardiovascular: negative for chest pain, palpitations, leg swelling, orthopnea Respiratory: negative for SOB, wheezing or persistent cough Gastrointestinal: negative for abdominal pain Genitourinary: negative for dysuria or gross hematuria  Objective  Vitals: BP 122/84 Comment: right arm seated/cla  Pulse 83   Temp 98.4 F (36.9 C)   Ht 5\' 3"  (1.6 m)   Wt 126 lb (57.2 kg)   SpO2 98%   BMI 22.32 kg/m  General: no acute distress  Psych:  Alert and oriented, normal mood and affect HEENT:  Normocephalic, atraumatic, supple neck  Cardiovascular:  RRR without murmur. no edema Respiratory:  Good breath sounds bilaterally, CTAB with normal respiratory effort Neurologic:   Mental status is normal Commons side effects, risks, benefits, and alternatives for medications and treatment plan prescribed today were discussed, and the patient expressed understanding of the given instructions. Patient is instructed to call or message via MyChart if he/she has any questions or concerns regarding our treatment plan. No barriers to understanding were identified. We discussed Red Flag symptoms and signs in detail. Patient expressed understanding regarding what to do in case of urgent or emergency type symptoms.  Medication list was reconciled, printed and provided to the patient in AVS. Patient instructions and summary information was reviewed with the patient as documented in the AVS. This note was prepared with assistance of Dragon voice recognition software. Occasional wrong-word or sound-a-like substitutions may have occurred due to the inherent limitation

## 2023-04-16 NOTE — Patient Instructions (Signed)
Please return in 3 months to recheck blood pressure   I will release your lab results to you on your MyChart account with further instructions. You may see the results before I do, but when I review them I will send you a message with my report or have my assistant call you if things need to be discussed. Please reply to my message with any questions. Thank you!   Please continue checking your sugars at home and documenting the numbers and bring that back for review at your next visit.  I will reach out to you once your blood work is back and let you know if you want to adjust your medications any.  Have a wonderful holiday season!  If you have any questions or concerns, please don't hesitate to send me a message via MyChart or call the office at [phone]. Thank you for visiting with [place] today! It's our pleasure caring for you.

## 2023-04-20 ENCOUNTER — Encounter: Payer: Self-pay | Admitting: Family Medicine

## 2023-04-23 ENCOUNTER — Telehealth: Payer: Self-pay

## 2023-04-23 MED ORDER — VALSARTAN-HYDROCHLOROTHIAZIDE 80-12.5 MG PO TABS: 1.0000 | ORAL_TABLET | Freq: Every day | ORAL | 3 refills | Status: DC

## 2023-04-23 NOTE — Addendum Note (Signed)
Addended by: Asencion Partridge on: 04/23/2023 09:27 AM   Modules accepted: Orders

## 2023-04-23 NOTE — Progress Notes (Signed)
Spoke with pt regarding lab results/recommendations 

## 2023-04-23 NOTE — Progress Notes (Signed)
Please call patient: labs show that potassium is a little low on the bp pill; so I am changing it to a different blood pressure pill called valsartan HCT. I have sent this in to the family. She can stop the chlorthalidone. Keep monitoring bp at home and follow up with me in 3 months. Bring home readings. Thanks!

## 2023-04-23 NOTE — Telephone Encounter (Signed)
Spoke with pt regarding lab results/recommendations. Pt understood. Pt stated that she needs to reschedule her upcoming appt to a Friday instead of a Monday. Please accommodate pt's request and call her to let her know her new appt date.  Thank you,  Christy Gentles

## 2023-05-12 ENCOUNTER — Telehealth: Payer: Self-pay | Admitting: Family Medicine

## 2023-05-18 NOTE — Telephone Encounter (Signed)
 Copied from CRM [id number]. Topic: General - Inquiry >> May 18, 2023 12:49 PM Kinnie H wrote: Reason for CRM: Wanting to know if the letter for motion sickness is ready for pickup at the office >> May 18, 2023  1:21 PM Havlyn R wrote: Letter is not up front--can we confirm location of letter?  Can you help me with this? Pt stated that her job is requiring her to travel and she has motion sickness and is requesting a letter so that she does not have to travel.

## 2023-05-19 ENCOUNTER — Ambulatory Visit: Payer: Managed Care, Other (non HMO) | Admitting: Internal Medicine

## 2023-05-26 ENCOUNTER — Encounter: Payer: Self-pay | Admitting: Family Medicine

## 2023-05-26 ENCOUNTER — Ambulatory Visit: Payer: Managed Care, Other (non HMO) | Admitting: Family Medicine

## 2023-05-26 VITALS — BP 124/86 | HR 85 | Temp 98.1°F | Ht 63.0 in | Wt 130.6 lb

## 2023-05-26 DIAGNOSIS — I10 Essential (primary) hypertension: Secondary | ICD-10-CM

## 2023-05-26 DIAGNOSIS — T502X5A Adverse effect of carbonic-anhydrase inhibitors, benzothiadiazides and other diuretics, initial encounter: Secondary | ICD-10-CM | POA: Diagnosis not present

## 2023-05-26 DIAGNOSIS — T753XXS Motion sickness, sequela: Secondary | ICD-10-CM

## 2023-05-26 DIAGNOSIS — E876 Hypokalemia: Secondary | ICD-10-CM | POA: Diagnosis not present

## 2023-05-26 LAB — BASIC METABOLIC PANEL
BUN: 12 mg/dL (ref 6–23)
CO2: 31 meq/L (ref 19–32)
Calcium: 9.5 mg/dL (ref 8.4–10.5)
Chloride: 100 meq/L (ref 96–112)
Creatinine, Ser: 0.56 mg/dL (ref 0.40–1.20)
GFR: 105.17 mL/min (ref >60.00)
Glucose, Bld: 161 mg/dL — ABNORMAL HIGH (ref 70–99)
Potassium: 3.4 meq/L — ABNORMAL LOW (ref 3.5–5.1)
Sodium: 139 meq/L (ref 135–145)

## 2023-05-26 MED ORDER — ONDANSETRON 4 MG PO TBDP: 4.0000 mg | ORAL_TABLET | Freq: Three times a day (TID) | ORAL | 0 refills | Status: DC | PRN

## 2023-05-26 MED ORDER — MECLIZINE HCL 25 MG PO TABS: 25.0000 mg | ORAL_TABLET | Freq: Three times a day (TID) | ORAL | 0 refills | Status: AC | PRN

## 2023-05-26 NOTE — Patient Instructions (Addendum)
Please return in October for your annual complete physical; please come fasting.   I will release your lab results to you on your MyChart account with further instructions. You may see the results before I do, but when I review them I will send you a message with my report or have my assistant call you if things need to be discussed. Please reply to my message with any questions. Thank you!   If you have any questions or concerns, please don't hesitate to send me a message via MyChart or call the office at [phone]. Thank you for visiting with [place] today! It's our pleasure caring for you.   VISIT SUMMARY:  You visited today due to your longstanding and severe motion sickness, which has been affecting you since childhood. This condition has significantly impacted your daily life and ability to travel, especially for work-related events.  YOUR PLAN:  -MOTION SICKNESS: Motion sickness is a condition that causes nausea, dizziness, and sometimes vomiting when traveling by car, bus, or plane. We discussed prescribing Zofran to help manage your nausea without causing drowsiness. Additionally, we talked about using meclizine for vertigo and motion sickness, which may cause drowsiness but could be useful when you are not driving. A letter will be provided to your employer to excuse you from the upcoming work-related trip due to your severe motion sickness.  -HYPERTENSION is now well controlled. We will recheck your potassium on the new medication.  INSTRUCTIONS:  Please follow up to evaluate the effectiveness of Zofran and meclizine in managing your motion sickness symptoms.

## 2023-05-26 NOTE — Progress Notes (Signed)
Subjective  CC:  Chief Complaint  Patient presents with   Kinetosis    Pt stated that her company is having a business event and she would like a letter to state that she can not go bc traveling far makes her nausea, dizzy and stomach issues    HPI: Morgan Conley is a 53 y.o. female who presents to the office today to address the problems listed above in the chief complaint. Discussed the use of AI scribe software for clinical note transcription with the patient, who gave verbal consent to proceed.  History of Present Illness   The patient presents with a longstanding history of severe motion sickness, which has been a problem since her early childhood. This condition significantly impacts her daily life, preventing her from using public transportation and causing her to avoid air travel, even for important family visits. The patient reports that she is able to manage the symptoms somewhat by driving herself for shorter durations.  The motion sickness is described as severe, with the patient experiencing significant nausea, particularly when she is a passenger in a vehicle or on a bus. The symptoms are exacerbated when the patient is feeling unwell from other causes, such as a cold or lack of sleep. The patient has tried various over-the-counter and traditional Chinese remedies for the motion sickness, but none have provided significant relief. The patient reports that these medications often make her feel drowsy and dizzy, which further impairs her ability to function.  The patient's job has recently required her to travel for a five-day event, which has caused significant concern due to her severe motion sickness. This event would require bus travel to an airport, followed by a flight to [place], and daily travel to an arena. The patient has previously been exempt from this event, but this year she was included in the list of attendees. The patient has requested a letter from the doctor to excuse  her from this travel due to her severe motion sickness.      Htn f/u on valsartan-hct due to hypokalemia on chlorthalidone. Doing well on meds. Home bp normal.   Assessment  1. Motion sickness, sequela   2. Essential hypertension   3. 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 Symptoms include nausea, dizziness, and drowsiness. Previous treatments with over-the-counter and Chinese medicines were ineffective and caused adverse effects such as stomach discomfort and drowsiness. She avoids flying and cruises due to symptom severity. Requires a letter for her employer to excuse her from a work-related trip involving bus and air travel. Discussed Zofran for nausea management, which does not cause drowsiness, and meclizine for vertigo and motion sickness, which may cause drowsiness but could be beneficial if she is not driving. Prescription medications generally have better efficacy compared to over-the-counter options.   - Provide a letter to her employer excusing her from the work-related trip.   - Prescribe Zofran for nausea management.   - Discuss the use of meclizine for vertigo and motion sickness, especially for situations where drowsiness is not a concern.    HTN - well controlled Recheck potassium  F/u October for cpe (can cancel bp f/u in march)     Orders Placed This Encounter  Procedures   Basic metabolic panel   Meds ordered this encounter  Medications   ondansetron (ZOFRAN-ODT) 4 MG disintegrating tablet  Sig: Take 1 tablet (4 mg total) by mouth every 8 (eight) hours as needed for nausea or vomiting.    Dispense:  20 tablet    Refill:  0   meclizine (ANTIVERT) 25 MG tablet    Sig: Take 1 tablet (25 mg total) by mouth 3 (three) times daily as needed for dizziness.    Dispense:  30 tablet    Refill:  0     I reviewed the patients updated PMH, FH,  and SocHx.    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   meclizine (ANTIVERT) 25 MG tablet Take 1 tablet (25 mg total) by mouth 3 (three) times daily as needed for dizziness.   ondansetron (ZOFRAN-ODT) 4 MG disintegrating tablet Take 1 tablet (4 mg total) by mouth every 8 (eight) hours as needed for nausea or vomiting.   triamcinolone cream (KENALOG) 0.1 % Apply 1 Application topically 2 (two) times daily. For 2 weeks, then as needed   valsartan-hydrochlorothiazide (DIOVAN-HCT) 80-12.5 MG tablet Take 1 tablet by mouth daily.    Allergies: Patient has no known allergies. Family History: Patient family history includes Hypertension in her father. Social History:  Patient  reports that she has never smoked. She has never used smokeless tobacco. She reports current alcohol use. She reports that she does not use drugs.  Review of Systems: Constitutional: Negative for fever malaise or anorexia Cardiovascular: negative for chest pain Respiratory: negative for SOB or persistent cough Gastrointestinal: negative for abdominal pain  Objective  Vitals: BP 124/86   Pulse 85   Temp 98.1 F (36.7 C)   Ht 5\' 3"  (1.6 m)   Wt 130 lb 9.6 oz (59.2 kg)   SpO2 97%   BMI 23.13 kg/m  General: no acute distress , A&Ox3 HEENT: PEERL, conjunctiva normal, neck is supple Cardiovascular:  RRR without murmur or gallop.  Respiratory:  Good breath sounds bilaterally, CTAB with normal respiratory effort Skin:  Warm, no rashes  Commons side effects, risks, benefits, and alternatives for medications and treatment plan prescribed today were discussed, and the patient  expressed understanding of the given instructions. Patient is instructed to call or message via MyChart if he/she has any questions or concerns regarding our treatment plan. No barriers to understanding were identified. We discussed Red Flag symptoms and signs in detail. Patient expressed understanding regarding what to do in case of urgent or emergency type symptoms.  Medication list was reconciled, printed and provided to the patient in AVS. Patient instructions and summary information was reviewed with the patient as documented in the AVS. This note was prepared with assistance of Dragon voice recognition software. Occasional wrong-word or sound-a-like substitutions may have occurred due to the inherent limitations of voice recognition software

## 2023-05-27 ENCOUNTER — Encounter: Payer: Self-pay | Admitting: Family Medicine

## 2023-05-27 NOTE — Progress Notes (Signed)
See my chart note.

## 2023-07-09 ENCOUNTER — Telehealth: Payer: Self-pay

## 2023-07-09 NOTE — Telephone Encounter (Signed)
 Copied from CRM [id number]. Topic: Clinical - Medical Advice >> Jul 09, 2023 10:39 AM Efraim Kaufmann C wrote: Reason for CRM: Patient called regarding shot she received on her December 13th appointment- Shingrix No. 1. She stated she is supposed to get another shot and wasn't sure if it was too soon or too late and was wondering if someone could call her back to let her know and if needed schedule an appointment for the next shot. Thank you

## 2023-07-19 ENCOUNTER — Ambulatory Visit: Payer: Managed Care, Other (non HMO) | Admitting: Family Medicine

## 2023-07-23 ENCOUNTER — Ambulatory Visit: Payer: Managed Care, Other (non HMO) | Admitting: Family Medicine

## 2023-07-29 ENCOUNTER — Ambulatory Visit (INDEPENDENT_AMBULATORY_CARE_PROVIDER_SITE_OTHER)

## 2023-07-29 DIAGNOSIS — Z23 Encounter for immunization: Secondary | ICD-10-CM

## 2023-07-29 NOTE — Progress Notes (Signed)
 Patient is in office today for a nurse visit for Immunization(shingrix). Patient Injection was given in the  Left deltoid. Patient tolerated injection well.

## 2024-02-22 LAB — HEMOGLOBIN A1C: Hemoglobin A1C: 5.7

## 2024-02-22 LAB — BASIC METABOLIC PANEL WITH GFR
BUN: 14 (ref 4–21)
Chloride: 102 (ref 99–108)
Creatinine: 0.7 (ref 0.5–1.1)
Glucose: 106
Potassium: 3.9 meq/L (ref 3.5–5.1)
Sodium: 139 (ref 137–147)

## 2024-02-22 LAB — LIPID PANEL
Cholesterol: 220 — AB (ref 0–200)
HDL: 65 (ref 35–70)
LDL Cholesterol: 135
Triglycerides: 113 (ref 40–160)

## 2024-02-22 LAB — CBC AND DIFFERENTIAL
HCT: 43 (ref 36–46)
Hemoglobin: 13.9 (ref 12.0–16.0)
Platelets: 204 K/uL (ref 150–400)
WBC: 4.2

## 2024-02-22 LAB — COMPREHENSIVE METABOLIC PANEL WITH GFR
Albumin: 4.3 (ref 3.5–5.0)
Calcium: 9.6 (ref 8.7–10.7)
eGFR: 105

## 2024-02-22 LAB — HEPATIC FUNCTION PANEL
ALT: 24 U/L (ref 7–35)
AST: 21 (ref 13–35)
Alkaline Phosphatase: 102 (ref 25–125)

## 2024-02-22 LAB — CBC: RBC: 4.74 (ref 3.87–5.11)

## 2024-02-22 LAB — VITAMIN D 25 HYDROXY (VIT D DEFICIENCY, FRACTURES): Vit D, 25-Hydroxy: 19.4

## 2024-03-07 ENCOUNTER — Encounter: Payer: Self-pay | Admitting: Family Medicine

## 2024-03-07 ENCOUNTER — Ambulatory Visit (INDEPENDENT_AMBULATORY_CARE_PROVIDER_SITE_OTHER): Payer: Managed Care, Other (non HMO) | Admitting: Family Medicine

## 2024-03-07 VITALS — BP 120/86 | HR 63 | Temp 98.1°F | Ht 63.0 in | Wt 132.0 lb

## 2024-03-07 DIAGNOSIS — J301 Allergic rhinitis due to pollen: Secondary | ICD-10-CM

## 2024-03-07 DIAGNOSIS — E559 Vitamin D deficiency, unspecified: Secondary | ICD-10-CM | POA: Diagnosis not present

## 2024-03-07 DIAGNOSIS — Z0001 Encounter for general adult medical examination with abnormal findings: Secondary | ICD-10-CM | POA: Diagnosis not present

## 2024-03-07 DIAGNOSIS — I10 Essential (primary) hypertension: Secondary | ICD-10-CM

## 2024-03-07 DIAGNOSIS — R7301 Impaired fasting glucose: Secondary | ICD-10-CM | POA: Diagnosis not present

## 2024-03-07 DIAGNOSIS — L309 Dermatitis, unspecified: Secondary | ICD-10-CM | POA: Diagnosis not present

## 2024-03-07 MED ORDER — TRIAMCINOLONE ACETONIDE 0.1 % EX CREA: 1.0000 | TOPICAL_CREAM | Freq: Two times a day (BID) | CUTANEOUS | 0 refills | Status: AC

## 2024-03-07 MED ORDER — VALSARTAN-HYDROCHLOROTHIAZIDE 160-12.5 MG PO TABS: 1.0000 | ORAL_TABLET | Freq: Every day | ORAL | 3 refills | Status: AC

## 2024-03-07 NOTE — Patient Instructions (Addendum)
 Please return in 12 months for your annual complete physical; please come fasting.   If you have any questions or concerns, please don't hesitate to send me a message via MyChart or call the office at [phone]. Thank you for visiting with us  today! It's our pleasure caring for you.    VISIT SUMMARY: Today, you had a routine follow-up visit to discuss your hypertension, vitamin D  supplementation, dry skin, and allergies. We also reviewed your general health maintenance, including scheduling a mammogram.  YOUR PLAN: -ESSENTIAL HYPERTENSION: Hypertension is high blood pressure. Your blood pressure readings are generally around 120/82 mmHg, but we aim to reduce your diastolic pressure to the 60s or 70s. We have increased your Diovan  HCT dosage to 160/12.5 mg. Please monitor your blood pressure and report any symptoms of lightheadedness or feeling unwell.  -VITAMIN D  SUPPLEMENTATION: Vitamin D  is important for bone health and overall well-being. You recently started taking a vitamin D  supplement due to low levels. Continue taking 2000 units daily or 5000 units every other day.  -PRURITUS AND XEROSIS (DRY SKIN): Pruritus and xerosis refer to itchy and dry skin. You experience dry skin, especially during fall and winter. We have prescribed triamcinolone  cream for the itchy spots, to be used for 1-2 weeks. Additionally, use a good moisturizer like Lubriderm or Eucerin.  -ALLERGIC RHINITIS: Allergic rhinitis is an allergic reaction that causes sneezing, congestion, and a runny nose. Your symptoms are well-controlled with Zyrtec . Continue taking Zyrtec  as needed.  INSTRUCTIONS: Please schedule a mammogram with [facility]. Continue monitoring your blood pressure and report any symptoms of lightheadedness or feeling unwell. Follow the prescribed regimen for your vitamin D  supplementation and use the triamcinolone  cream for 1-2 weeks for your dry skin. Continue taking Zyrtec  for your  allergies as needed.                      Contains text generated by [employer].                                 Contains text generated by [employer].

## 2024-03-07 NOTE — Progress Notes (Signed)
 Subjective  Chief Complaint  Patient presents with   Annual Exam    Pt here for Annual Exam and is currently fasting. Mammo has not been done yet   Hypertension    HPI: Morgan Conley is a 53 y.o. female who presents to [facility] [place] today for a Female Wellness Visit. She also has the concerns and/or needs as listed above in the chief complaint. These will be addressed in addition to the Health Maintenance Visit.   Wellness Visit: annual visit with health maintenance review and exam  HM: overdue for mammogram. Gets with Dr. Taavon and will schedule. Other screens are up to date; although she declines flu and prevnar today.   Chronic disease f/u and/or acute problem visit: (deemed necessary to be done in addition to the wellness visit): Discussed the use of AI scribe software for clinical note transcription with the patient, who gave verbal consent to proceed.  History of Present Illness Morgan Conley is a 53 year old female with hypertension who presents for medication adjustment and routine follow-up.  Hypertension and associated symptoms - Blood pressure readings around 120/82 mmHg, with diastolic values often in the 80s - Currently taking Diovan  HCT 80/12.5 mg daily - No recent changes in blood pressure symptoms - Ringing in the ears, which can be associated with blood pressure - Feeling well. Taking medications w/o adverse effects. No symptoms of CHF, angina; no palpitations, sob, cp or lower extremity edema. Compliant with meds.   Vitamind deficiency; doesn't take D regularly. No sxs  Eczema: - Dry skin, particularly during fall and winter months - Itchiness persists despite use of Aveeno moisturizer after showering - has used TAC cream successfully in past  Allergic rhinitis - Takes Zyrtec  for allergies - Allergy symptoms currently controlled  IFG: with recent A1c 5.7  Reviewed wellness labs from work: see abstract; lipids, cmp and tsh normal. Low D  in the 20s and LDL 130s.       Assessment  1. Encounter for well adult exam with abnormal findings   2. Essential hypertension   3. IFG (impaired fasting glucose)   4. Seasonal allergic rhinitis due to pollen   5. Eczema, unspecified type   6. Vitamin D  deficiency      Plan  Female Wellness Visit: Age appropriate Health Maintenance and Prevention measures were discussed with patient. Included topics are cancer screening recommendations, ways to keep healthy (see AVS) including dietary and exercise recommendations, regular eye and dental care, use of seat belts, and avoidance of moderate alcohol use and tobacco use.  BMI: discussed patient's BMI and encouraged positive lifestyle modifications to help get to or maintain a target BMI. HM needs and immunizations were addressed and ordered. See below for orders. See HM and immunization section for updates. Routine labs and screening tests ordered including cmp, cbc and lipids where appropriate. Discussed recommendations regarding Vit D and calcium supplementation (see AVS)  Chronic disease management visit and/or acute problem visit: Assessment and Plan Assessment & Plan Adult Wellness Visit Routine adult wellness visit with discussion on general health maintenance, including mammogram scheduling and vitamin D  supplementation. - Schedule mammogram [facility]. - Continue vitamin D  supplementation at 2000 units daily or 5000 units every other day.  Essential hypertension Blood pressure readings typically in the 120s/80s range. Goal is to reduce diastolic pressure to the 60s or 70s. Current medication is Diovan  HCT 80/12.5 mg. - Increased Diovan  HCT to 160/12.5 mg. - Monitor blood  pressure and report any symptoms of lightheadedness or feeling unwell. - nl renal function and lytes  Impaired fasting glucose Improving with better recent A1c 5.7;continue healthy diet and exercise  Allergic rhinitis due to  pollen Allergies are well-controlled with Zyrtec . Tinnitus may be related to allergies, hearing loss, or blood pressure. - Continue Zyrtec  for allergy management.  Eczema Reports of dry, itchy skin with loose items and itchy spots. Possible dry skin causing itchiness. - Prescribed triamcinolone  cream for itchy spots, to be used for 1-2 weeks. - Use a good moisturizer like Lubriderm or Eucerin.    Follow up: 12 mo for cpe and chronic problem f/u  No orders of the defined types were placed in this encounter.  Meds ordered this encounter  Medications   valsartan -hydrochlorothiazide  (DIOVAN -HCT) 160-12.5 MG tablet    Sig: Take 1 tablet by mouth daily.    Dispense:  90 tablet    Refill:  3   triamcinolone  cream (KENALOG ) 0.1 %    Sig: Apply 1 Application topically 2 (two) times daily. For 2 weeks, then as needed    Dispense:  45 g    Refill:  0      Body mass index is 23.38 kg/m. Wt Readings from Last 3 Encounters:  03/07/24 132 lb (59.9 kg)  05/26/23 130 lb 9.6 oz (59.2 kg)  04/16/23 126 lb (57.2 kg)     Patient Active Problem List   Diagnosis Date Noted   Essential hypertension 04/16/2023    Priority: High    Diagnosis January 2023.  Started losartan  HCTZ, low-dose.  Only took for 1 month.  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    Priority: Low   Allergic rhinitis 10/15/2012    Priority: Low   Eczema 03/07/2024   Vitamin D  deficiency 03/07/2024   Health Maintenance  Topic Date Due   Hepatitis B Vaccines 19-59 Average Risk (1 of 3 - 19+ 3-dose series) Never done   Mammogram  11/12/2023   COVID-19 Vaccine (3 - 2025-26 season) 03/23/2024 (Originally 01/03/2024)   Influenza Vaccine  08/01/2024 (Originally 12/03/2023)    Pneumococcal Vaccine: 50+ Years (1 of 1 - PCV) 03/07/2025 (Originally 04/20/2021)   Fecal DNA (Cologuard)  12/11/2025   Cervical Cancer Screening (HPV/Pap Cotest)  11/12/2027   DTaP/Tdap/Td (2 - Td or Tdap) 04/15/2033   Hepatitis C Screening  Completed   HIV Screening  Completed   Zoster Vaccines- Shingrix   Completed   HPV VACCINES  Aged Out   Meningococcal B Vaccine  Aged Out   Immunization History  Administered Date(s) Administered   Influenza,inj,Quad PF,6+ Mos 05/12/2021   PFIZER(Purple Top)SARS-COV-2 Vaccination 09/09/2019, 10/03/2019   Tdap 04/16/2023   Zoster Recombinant(Shingrix ) 04/16/2023, 07/29/2023   We updated and reviewed the patient's past history in detail and it is documented below. Allergies: Patient has no known allergies. Past Medical History Patient  has a past medical history of Allergy and Recurrent cold sores (12/14/2017). Past Surgical History Patient  has a past surgical history that includes Cesarean section (2003, 2011). Family History: Patient family history includes Hypertension in her father. Social History:  Patient  reports that she has never smoked. She has never used smokeless tobacco. She reports current alcohol use. She reports that she  does not use drugs.  Review of Systems: Constitutional: negative for fever or malaise Ophthalmic: negative for photophobia, double vision or loss of vision Cardiovascular: negative for chest pain, dyspnea on exertion, or new LE swelling Respiratory: negative for SOB or persistent cough Gastrointestinal: negative for abdominal pain, change in bowel habits or melena Genitourinary: negative for dysuria or gross hematuria, no abnormal uterine bleeding or disharge Musculoskeletal: negative for new gait disturbance or muscular weakness Integumentary: negative for new or persistent rashes, no breast lumps Neurological: negative for TIA or stroke symptoms Psychiatric: negative for SI or  delusions Allergic/Immunologic: negative for hives  Patient Care Team    Relationship Specialty Notifications Start End  Jodie Lavern CROME, MD PCP - General Family Medicine  01/10/18   Gorge Ade, MD Consulting Physician Obstetrics and Gynecology  03/04/23     Objective  Vitals: BP 120/86   Pulse 63   Temp 98.1 F (36.7 C)   Ht 5' 3 (1.6 m)   Wt 132 lb (59.9 kg)   SpO2 99%   BMI 23.38 kg/m  General:  Well developed, well nourished, no acute distress  Psych:  Alert and orientedx3,normal mood and affect HEENT:  Normocephalic, atraumatic, non-icteric sclera,  supple neck without adenopathy, mass or thyromegaly Cardiovascular:  Normal S1, S2, RRR without gallop, rub or murmur Respiratory:  Good breath sounds bilaterally, CTAB with normal respiratory effort Gastrointestinal: normal bowel sounds, soft, non-tender, no noted masses. No HSM MSK: extremities without edema, joints without erythema or swelling Neurologic:    Mental status is normal.  Gross motor and sensory exams are normal.  No tremor Skin: few dry patches on trunk  Commons side effects, risks, benefits, and alternatives for medications and treatment plan prescribed today were discussed, and the patient expressed understanding of the given instructions. Patient is instructed to call or message via MyChart if he/she has any questions or concerns regarding our treatment plan. No barriers to understanding were identified. We discussed Red Flag symptoms and signs in detail. Patient expressed understanding regarding what to do in case of urgent or emergency type symptoms.  Medication list was reconciled, printed and provided to the patient in AVS. Patient instructions and summary information was reviewed with the patient as documented in the AVS. This note was prepared with assistance of Dragon voice recognition software. Occasional wrong-word or sound-a-like substitutions may have occurred due to the inherent limitations of voice  recognition software

## 2025-03-09 ENCOUNTER — Encounter: Admitting: Family Medicine
